# Patient Record
Sex: Female | Born: 1965 | Race: White | Hispanic: Yes | Smoking: Current every day smoker
Health system: Southern US, Community
[De-identification: ages and names within clinical notes are randomized; demographics above are authoritative.]

## PROBLEM LIST (undated history)

## (undated) ENCOUNTER — Ambulatory Visit (HOSPITAL_COMMUNITY): Admission: EM | Payer: Medicaid Other

## (undated) DIAGNOSIS — D649 Anemia, unspecified: Secondary | ICD-10-CM

## (undated) DIAGNOSIS — E119 Type 2 diabetes mellitus without complications: Secondary | ICD-10-CM

## (undated) HISTORY — PX: NO PAST SURGERIES: SHX2092

## (undated) HISTORY — DX: Type 2 diabetes mellitus without complications: E11.9

---

## 2000-07-30 ENCOUNTER — Encounter: Payer: Self-pay | Admitting: Internal Medicine

## 2000-07-30 ENCOUNTER — Ambulatory Visit (HOSPITAL_COMMUNITY): Admission: RE | Admit: 2000-07-30 | Discharge: 2000-07-30 | Payer: Self-pay | Admitting: Internal Medicine

## 2000-08-09 ENCOUNTER — Inpatient Hospital Stay (HOSPITAL_COMMUNITY): Admission: AD | Admit: 2000-08-09 | Discharge: 2000-08-09 | Payer: Self-pay | Admitting: Obstetrics & Gynecology

## 2000-08-09 ENCOUNTER — Encounter: Payer: Self-pay | Admitting: Obstetrics & Gynecology

## 2000-10-28 ENCOUNTER — Emergency Department (HOSPITAL_COMMUNITY): Admission: EM | Admit: 2000-10-28 | Discharge: 2000-10-28 | Payer: Self-pay | Admitting: Emergency Medicine

## 2002-08-13 ENCOUNTER — Ambulatory Visit (HOSPITAL_COMMUNITY): Admission: RE | Admit: 2002-08-13 | Discharge: 2002-08-13 | Payer: Self-pay | Admitting: *Deleted

## 2002-09-11 ENCOUNTER — Inpatient Hospital Stay (HOSPITAL_COMMUNITY): Admission: AD | Admit: 2002-09-11 | Discharge: 2002-09-11 | Payer: Self-pay | Admitting: *Deleted

## 2002-09-18 ENCOUNTER — Encounter (HOSPITAL_COMMUNITY): Admission: RE | Admit: 2002-09-18 | Discharge: 2002-09-18 | Payer: Self-pay | Admitting: *Deleted

## 2002-09-20 ENCOUNTER — Inpatient Hospital Stay (HOSPITAL_COMMUNITY): Admission: AD | Admit: 2002-09-20 | Discharge: 2002-09-20 | Payer: Self-pay | Admitting: Obstetrics and Gynecology

## 2002-09-20 ENCOUNTER — Inpatient Hospital Stay (HOSPITAL_COMMUNITY): Admission: AD | Admit: 2002-09-20 | Discharge: 2002-09-22 | Payer: Self-pay | Admitting: Obstetrics and Gynecology

## 2002-09-20 ENCOUNTER — Encounter: Payer: Self-pay | Admitting: Obstetrics and Gynecology

## 2007-11-18 ENCOUNTER — Emergency Department (HOSPITAL_COMMUNITY): Admission: EM | Admit: 2007-11-18 | Discharge: 2007-11-19 | Payer: Self-pay | Admitting: Emergency Medicine

## 2009-01-28 ENCOUNTER — Emergency Department (HOSPITAL_COMMUNITY): Admission: EM | Admit: 2009-01-28 | Discharge: 2009-01-28 | Payer: Self-pay | Admitting: Emergency Medicine

## 2010-10-15 LAB — HEPATITIS PANEL, ACUTE
HCV Ab: NEGATIVE
Hep A IgM: NEGATIVE

## 2010-10-15 LAB — COMPREHENSIVE METABOLIC PANEL
ALT: 80 U/L — ABNORMAL HIGH (ref 0–35)
AST: 66 U/L — ABNORMAL HIGH (ref 0–37)
Alkaline Phosphatase: 115 U/L (ref 39–117)
CO2: 27 mEq/L (ref 19–32)
Calcium: 9.1 mg/dL (ref 8.4–10.5)
GFR calc Af Amer: >60 mL/min (ref >60)
GFR calc non Af Amer: >60 mL/min (ref >60)
Glucose, Bld: 97 mg/dL (ref 70–99)
Potassium: 3.4 mEq/L — ABNORMAL LOW (ref 3.5–5.1)
Sodium: 142 mEq/L (ref 135–145)

## 2010-10-15 LAB — CBC
HCT: 38.8 % (ref 36.0–46.0)
Hemoglobin: 12.8 g/dL (ref 12.0–15.0)
MCHC: 32.9 g/dL (ref 30.0–36.0)
MCV: 81.7 fL (ref 78.0–100.0)
Platelets: 327 10*3/uL (ref 150–400)
RBC: 4.75 MIL/uL (ref 3.87–5.11)
RDW: 17.4 % — ABNORMAL HIGH (ref 11.5–15.5)
WBC: 6.9 10*3/uL (ref 4.0–10.5)

## 2010-10-15 LAB — URINE MICROSCOPIC-ADD ON

## 2010-10-15 LAB — URINALYSIS, ROUTINE W REFLEX MICROSCOPIC
Bilirubin Urine: NEGATIVE
Ketones, ur: NEGATIVE mg/dL
Specific Gravity, Urine: 1.02 (ref 1.005–1.030)
pH: 6.5 (ref 5.0–8.0)

## 2010-10-15 LAB — DIFFERENTIAL
Basophils Relative: 0 % (ref 0–1)
Eosinophils Absolute: 0.1 10*3/uL (ref 0.0–0.7)
Eosinophils Relative: 1 % (ref 0–5)
Lymphs Abs: 2.6 10*3/uL (ref 0.7–4.0)
Monocytes Relative: 8 % (ref 3–12)

## 2010-10-15 LAB — URINE CULTURE

## 2010-10-15 LAB — LIPASE, BLOOD: Lipase: 19 U/L (ref 11–59)

## 2011-06-15 ENCOUNTER — Emergency Department (HOSPITAL_COMMUNITY)
Admission: EM | Admit: 2011-06-15 | Discharge: 2011-06-15 | Disposition: A | Discharge status: Home / Self Care | Payer: Medicaid Other | Attending: Emergency Medicine | Admitting: Emergency Medicine

## 2011-06-15 ENCOUNTER — Encounter: Payer: Self-pay | Admitting: *Deleted

## 2011-06-15 ENCOUNTER — Emergency Department (HOSPITAL_COMMUNITY): Payer: Medicaid Other

## 2011-06-15 DIAGNOSIS — R05 Cough: Secondary | ICD-10-CM | POA: Insufficient documentation

## 2011-06-15 DIAGNOSIS — R112 Nausea with vomiting, unspecified: Secondary | ICD-10-CM | POA: Insufficient documentation

## 2011-06-15 DIAGNOSIS — IMO0001 Reserved for inherently not codable concepts without codable children: Secondary | ICD-10-CM | POA: Insufficient documentation

## 2011-06-15 DIAGNOSIS — R509 Fever, unspecified: Secondary | ICD-10-CM | POA: Insufficient documentation

## 2011-06-15 DIAGNOSIS — R51 Headache: Secondary | ICD-10-CM | POA: Insufficient documentation

## 2011-06-15 DIAGNOSIS — J069 Acute upper respiratory infection, unspecified: Secondary | ICD-10-CM | POA: Insufficient documentation

## 2011-06-15 DIAGNOSIS — R059 Cough, unspecified: Secondary | ICD-10-CM | POA: Insufficient documentation

## 2011-06-15 DIAGNOSIS — F17200 Nicotine dependence, unspecified, uncomplicated: Secondary | ICD-10-CM | POA: Insufficient documentation

## 2011-06-15 MED ORDER — ONDANSETRON 4 MG PO TBDP
4.0000 mg | ORAL_TABLET | Freq: Once | ORAL | Status: AC
  Administered 2011-06-15: 4 mg via ORAL
  Filled 2011-06-15: qty 1

## 2011-06-15 MED ORDER — OXYCODONE-ACETAMINOPHEN 5-325 MG PO TABS: 1.0000 | ORAL_TABLET | ORAL | Status: AC | PRN

## 2011-06-15 MED ORDER — ALBUTEROL SULFATE HFA 108 (90 BASE) MCG/ACT IN AERS
2.0000 | INHALATION_SPRAY | RESPIRATORY_TRACT | Status: DC | PRN
  Administered 2011-06-15: 2 via RESPIRATORY_TRACT
  Filled 2011-06-15: qty 6.7

## 2011-06-15 MED ORDER — OXYCODONE-ACETAMINOPHEN 5-325 MG PO TABS
1.0000 | ORAL_TABLET | Freq: Once | ORAL | Status: AC
  Administered 2011-06-15: 1 via ORAL
  Filled 2011-06-15: qty 1

## 2011-06-15 MED ORDER — ONDANSETRON 8 MG PO TBDP: 8.0000 mg | ORAL_TABLET | Freq: Three times a day (TID) | ORAL | Status: AC | PRN

## 2011-06-15 NOTE — ED Notes (Signed)
Patient reported to be sick for 2 weeks.  She has had cough with small amount of production.  She also reports fever.  Patient denies travel.  Patient complains of chest pain and bloody sputum as well that started yesterday

## 2011-06-15 NOTE — ED Provider Notes (Signed)
History     CSN: [id number] Arrival date & time: 06/15/2011  2:16 PM   First MD Initiated Contact with Patient 06/15/11 1641      Chief Complaint  Patient presents with  . Cough  . Fever  . Headache    (Consider location/radiation/quality/duration/timing/severity/associated sxs/prior treatment) Patient is a 45 y.o. female presenting with cough, fever, and headaches. The history is provided by the patient and a relative. The history is limited by a language barrier. A language interpreter was used.  Cough The current episode started more than 1 week ago. The problem occurs constantly. Associated symptoms include headaches and myalgias.  Fever Primary symptoms of the febrile illness include fever, headaches, cough, nausea, vomiting and myalgias. Primary symptoms do not include abdominal pain, dysuria or rash.  Headache  Associated symptoms include a fever, nausea and vomiting.   patient's had a cough for the last 2 weeks. Some fevers. She's coughed up a minimal amount of sputum. She also aches all over. She's also had nauseousness and vomiting. She's had some sick contacts with similar symptoms. She has myalgias. No relief with NyQuil at home.  No past medical history on file.  History reviewed. No pertinent past surgical history.  No family history on file.  History  Substance Use Topics  . Smoking status: Current Everyday Smoker  . Smokeless tobacco: Not on file  . Alcohol Use: Yes    OB History    Grav Para Term Preterm Abortions TAB SAB Ect Mult Living                  Review of Systems  Constitutional: Positive for fever.  Respiratory: Positive for cough.   Gastrointestinal: Positive for nausea and vomiting. Negative for abdominal pain.  Genitourinary: Negative for dysuria and flank pain.  Musculoskeletal: Positive for myalgias.  Skin: Negative for rash.  Neurological: Positive for headaches.    Allergies  Review of patient's allergies indicates no known  allergies.  Home Medications   Current Outpatient Rx  Name Route Sig Dispense Refill  . PSEUDOEPH-DOXYLAMINE-DM-APAP 60-7.12-05-998 MG/30ML PO LIQD Oral Take 30 mLs by mouth at bedtime as needed. For cold.     Marland Kitchen ONDANSETRON 8 MG PO TBDP Oral Take 1 tablet (8 mg total) by mouth every 8 (eight) hours as needed for nausea. 20 tablet 0  . OXYCODONE-ACETAMINOPHEN 5-325 MG PO TABS Oral Take 1 tablet by mouth every 4 (four) hours as needed for pain. 15 tablet 0    BP 132/90  Pulse 100  Temp(Src) 98.6 F (37 C) (Oral)  Resp 15  SpO2 95%  Physical Exam  Nursing note and vitals reviewed. Constitutional: She is oriented to person, place, and time. She appears well-developed and well-nourished.  HENT:  Head: Normocephalic and atraumatic.  Eyes: EOM are normal. Pupils are equal, round, and reactive to light.  Neck: Normal range of motion. Neck supple.  Cardiovascular: Regular rhythm and normal heart sounds.   No murmur heard.      Mild tachycardia  Pulmonary/Chest: Effort normal. No respiratory distress. She has wheezes. She has no rales.       Mild diffuse wheezes and prolonged expirations.  Abdominal: Soft. Bowel sounds are normal. She exhibits no distension. There is no tenderness. There is no rebound and no guarding.  Musculoskeletal: Normal range of motion.  Neurological: She is alert and oriented to person, place, and time. No cranial nerve deficit.  Skin: Skin is warm and dry.  Psychiatric: She has a normal mood  and affect. Her speech is normal.    ED Course  Procedures (including critical care time)  Labs Reviewed - No data to display Dg Chest 2 View  06/15/2011  *RADIOLOGY REPORT*  Clinical Data: Chest pain and shortness of breath.  CHEST - 2 VIEW  Comparison: Chest 11/19/2007.  Findings: Lungs are clear.  Heart size is normal.  No pneumothorax or effusion.  IMPRESSION: Negative chest.  Original Report Authenticated By: Bernadene Bell. D'ALESSIO, M.D.     1. URI (upper respiratory  infection)       MDM  Cough and URI symptoms. Negative x-ray. Patient is overall well-appearing. Her pulses normalize my exam. She be discharged home with nausea medicines and pain medicines and with an inhaler.        Juliet Rude. Rubin Payor, MD 06/15/11 1759

## 2011-06-15 NOTE — ED Notes (Signed)
Pt here with c/o cough, sore throat, fever and generalized body aches x 2 weeks.  Pt rates pain 10/10.

## 2012-11-05 ENCOUNTER — Encounter (HOSPITAL_COMMUNITY): Payer: Self-pay | Admitting: *Deleted

## 2012-11-05 ENCOUNTER — Telehealth (HOSPITAL_COMMUNITY): Payer: Self-pay | Admitting: *Deleted

## 2012-11-05 ENCOUNTER — Emergency Department (HOSPITAL_COMMUNITY)
Admission: EM | Admit: 2012-11-05 | Discharge: 2012-11-05 | Disposition: A | Discharge status: Home / Self Care | Payer: Self-pay | Attending: Emergency Medicine | Admitting: Emergency Medicine

## 2012-11-05 ENCOUNTER — Emergency Department (HOSPITAL_COMMUNITY): Payer: Self-pay

## 2012-11-05 DIAGNOSIS — F17200 Nicotine dependence, unspecified, uncomplicated: Secondary | ICD-10-CM | POA: Insufficient documentation

## 2012-11-05 DIAGNOSIS — J40 Bronchitis, not specified as acute or chronic: Secondary | ICD-10-CM

## 2012-11-05 DIAGNOSIS — J209 Acute bronchitis, unspecified: Secondary | ICD-10-CM | POA: Insufficient documentation

## 2012-11-05 LAB — POCT I-STAT, CHEM 8
Calcium, Ion: 1.23 mmol/L (ref 1.12–1.23)
Chloride: 105 mEq/L (ref 96–112)
HCT: 46 % (ref 36.0–46.0)
Potassium: 3.5 mEq/L (ref 3.5–5.1)
Sodium: 142 mEq/L (ref 135–145)

## 2012-11-05 LAB — D-DIMER, QUANTITATIVE: D-Dimer, Quant: <0.27 ug/mL-FEU (ref 0.00–0.48)

## 2012-11-05 MED ORDER — DOXYCYCLINE HYCLATE 100 MG PO CAPS: 100.0000 mg | ORAL_CAPSULE | Freq: Two times a day (BID) | ORAL | Status: DC

## 2012-11-05 MED ORDER — PREDNISONE 50 MG PO TABS: ORAL_TABLET | ORAL | Status: DC

## 2012-11-05 MED ORDER — ALBUTEROL (5 MG/ML) CONTINUOUS INHALATION SOLN
2.5000 mg/h | INHALATION_SOLUTION | Freq: Once | RESPIRATORY_TRACT | Status: AC
  Administered 2012-11-05: 2.5 mg/h via RESPIRATORY_TRACT
  Filled 2012-11-05: qty 20

## 2012-11-05 MED ORDER — ALBUTEROL SULFATE HFA 108 (90 BASE) MCG/ACT IN AERS: 1.0000 | INHALATION_SPRAY | Freq: Four times a day (QID) | RESPIRATORY_TRACT | Status: DC | PRN

## 2012-11-05 NOTE — ED Provider Notes (Signed)
History     CSN: [id number]  Arrival date & time 11/05/12  1237   First MD Initiated Contact with Patient 11/05/12 1342      Chief Complaint  Patient presents with  . Shortness of Breath    (Consider location/radiation/quality/duration/timing/severity/associated sxs/prior treatment) Patient is a 47 y.o. female presenting with shortness of breath. The history is provided by the patient. No language interpreter was used.  Shortness of Breath Severity:  Moderate Onset quality:  Gradual Duration:  1 day Timing:  Constant Progression:  Worsening Chronicity:  New Relieved by:  Nothing Worsened by:  Nothing tried Ineffective treatments:  None tried Associated symptoms: no chest pain, no vomiting and no wheezing   Risk factors: no recent alcohol use and no tobacco use     History reviewed. No pertinent past medical history.  History reviewed. No pertinent past surgical history.  History reviewed. No pertinent family history.  History  Substance Use Topics  . Smoking status: Current Every Day Smoker  . Smokeless tobacco: Not on file  . Alcohol Use: Yes    OB History   Grav Para Term Preterm Abortions TAB SAB Ect Mult Living                  Review of Systems  Respiratory: Positive for shortness of breath. Negative for wheezing.   Cardiovascular: Negative for chest pain.  Gastrointestinal: Negative for vomiting.  All other systems reviewed and are negative.    Allergies  Review of patient's allergies indicates no known allergies.  Home Medications   Current Outpatient Rx  Name  Route  Sig  Dispense  Refill  . Pseudoeph-Doxylamine-DM-APAP (NYQUIL) 60-7.12-05-998 MG/30ML LIQD   Oral   Take 30 mLs by mouth at bedtime as needed. For cold.            BP 147/79  Pulse 72  Temp(Src) 98 F (36.7 C) (Oral)  Resp 18  SpO2 95%  Physical Exam  Nursing note and vitals reviewed. Constitutional: She is oriented to person, place, and time. She appears  well-developed and well-nourished.  HENT:  Head: Normocephalic and atraumatic.  Right Ear: External ear normal.  Left Ear: External ear normal.  Nose: Nose normal.  Mouth/Throat: Oropharynx is clear and moist.  Eyes: Conjunctivae are normal. Pupils are equal, round, and reactive to light.  Neck: Normal range of motion. Neck supple.  Cardiovascular: Normal rate, normal heart sounds and intact distal pulses.   Pulmonary/Chest: Effort normal and breath sounds normal.  Abdominal: Soft. Bowel sounds are normal.  Musculoskeletal: Normal range of motion.  Neurological: She is alert and oriented to person, place, and time. She has normal reflexes.  Skin: Skin is warm.  Psychiatric: She has a normal mood and affect.    ED Course  Procedures (including critical care time)  Labs Reviewed  POCT I-STAT, CHEM 8 - Abnormal; Notable for the following:    Glucose, Bld 122 (*)    Hemoglobin 15.6 (*)    All other components within normal limits   No results found.   No diagnosis found.    MDM   Date: 11/05/2012  Rate: 67  Rhythm: normal sinus rhythm  QRS Axis: normal  Intervals: normal  ST/T Wave abnormalities: normal  Conduction Disutrbances:none  Narrative Interpretation:   Old EKG Reviewed: none available         Elson Areas, PA-C 11/05/12 1500

## 2012-11-05 NOTE — ED Notes (Signed)
Per family member that is translator, pt unable to tolerate mask.  RT notified and at bedside.

## 2012-11-05 NOTE — ED Provider Notes (Signed)
Care assumed from [facility]. Patient receiving albuterol and awaiting d-dimer.  D-dimer negative. Patient ambulatory without desaturation. We'll treat for bronchitis. Smoking cessation encouraged.  BP 124/76  Pulse 98  Temp(Src) 98 F (36.7 C) (Oral)  Resp 16  SpO2 94%   Glynn Octave, MD 11/05/12 [phone]

## 2012-11-05 NOTE — ED Notes (Signed)
RT paged for continuous albuterol treatment.

## 2012-11-05 NOTE — ED Notes (Signed)
Reports having sob since yesterday and feels like she is going to pass out. spo2 95% on room air. Pt ambulatory, no acute distress noted at this time.

## 2012-11-05 NOTE — ED Notes (Signed)
Checked patient pulse ox walking it was 96 room air then dropped down to 94 and back up to 96 room air while walking

## 2012-11-05 NOTE — ED Notes (Signed)
D/c prescriptions called in to [facility] pharmacy.

## 2012-11-07 NOTE — ED Provider Notes (Signed)
Medical screening examination/treatment/procedure(s) were performed by non-physician practitioner and as supervising physician I was immediately available for consultation/collaboration.   Gwyneth Sprout, MD 11/07/12 1414

## 2012-12-01 ENCOUNTER — Emergency Department (HOSPITAL_COMMUNITY): Payer: Self-pay

## 2012-12-01 ENCOUNTER — Encounter (HOSPITAL_COMMUNITY): Payer: Self-pay

## 2012-12-01 DIAGNOSIS — F17200 Nicotine dependence, unspecified, uncomplicated: Secondary | ICD-10-CM | POA: Insufficient documentation

## 2012-12-01 DIAGNOSIS — R062 Wheezing: Secondary | ICD-10-CM | POA: Insufficient documentation

## 2012-12-01 MED ORDER — IPRATROPIUM BROMIDE 0.02 % IN SOLN
0.5000 mg | Freq: Once | RESPIRATORY_TRACT | Status: AC
  Administered 2012-12-01: 0.5 mg via RESPIRATORY_TRACT
  Filled 2012-12-01: qty 2.5

## 2012-12-01 MED ORDER — ALBUTEROL SULFATE (5 MG/ML) 0.5% IN NEBU
5.0000 mg | INHALATION_SOLUTION | Freq: Once | RESPIRATORY_TRACT | Status: AC
  Administered 2012-12-01: 5 mg via RESPIRATORY_TRACT
  Filled 2012-12-01: qty 1

## 2012-12-01 NOTE — ED Notes (Signed)
Pt c/o SOB starting approx 45 mins ago, audible wheezing noted in triage. Pt denies chest pain, N/V, cough or congestion. Pt reports hx of the same 1 month ago. Received neb treatments and sent home

## 2012-12-02 ENCOUNTER — Emergency Department (HOSPITAL_COMMUNITY)
Admission: EM | Admit: 2012-12-02 | Discharge: 2012-12-02 | Disposition: A | Discharge status: Home / Self Care | Payer: Self-pay | Attending: Emergency Medicine | Admitting: Emergency Medicine

## 2012-12-02 DIAGNOSIS — R062 Wheezing: Secondary | ICD-10-CM

## 2012-12-02 LAB — CBC
HCT: 40.9 % (ref 36.0–46.0)
RDW: 13.1 % (ref 11.5–15.5)
WBC: 5.8 10*3/uL (ref 4.0–10.5)

## 2012-12-02 LAB — BASIC METABOLIC PANEL
Chloride: 106 mEq/L (ref 96–112)
Creatinine, Ser: 0.53 mg/dL (ref 0.50–1.10)
GFR calc Af Amer: >90 mL/min (ref >90)
Potassium: 3.5 mEq/L (ref 3.5–5.1)
Sodium: 144 mEq/L (ref 135–145)

## 2012-12-02 MED ORDER — ALBUTEROL SULFATE (5 MG/ML) 0.5% IN NEBU
5.0000 mg | INHALATION_SOLUTION | Freq: Once | RESPIRATORY_TRACT | Status: AC
  Administered 2012-12-02: 5 mg via RESPIRATORY_TRACT
  Filled 2012-12-02: qty 1

## 2012-12-02 MED ORDER — PREDNISONE 20 MG PO TABS
60.0000 mg | ORAL_TABLET | Freq: Once | ORAL | Status: AC
  Administered 2012-12-02: 60 mg via ORAL
  Filled 2012-12-02: qty 3

## 2012-12-02 MED ORDER — LEVOFLOXACIN 750 MG PO TABS
750.0000 mg | ORAL_TABLET | Freq: Once | ORAL | Status: AC
  Administered 2012-12-02: 750 mg via ORAL
  Filled 2012-12-02: qty 1

## 2012-12-02 MED ORDER — ALBUTEROL SULFATE HFA 108 (90 BASE) MCG/ACT IN AERS: 1.0000 | INHALATION_SPRAY | Freq: Four times a day (QID) | RESPIRATORY_TRACT | Status: DC | PRN

## 2012-12-02 MED ORDER — PREDNISONE 20 MG PO TABS: 60.0000 mg | ORAL_TABLET | Freq: Every day | ORAL | Status: DC

## 2012-12-02 MED ORDER — LEVOFLOXACIN 500 MG PO TABS: 500.0000 mg | ORAL_TABLET | Freq: Every day | ORAL | Status: DC

## 2012-12-02 NOTE — ED Provider Notes (Signed)
History     CSN: [id number]  Arrival date & time 12/01/12  2250   First MD Initiated Contact with Patient 12/02/12 0043      Chief Complaint  Patient presents with  . Shortness of Breath    (Consider location/radiation/quality/duration/timing/severity/associated sxs/prior treatment) HPI Hx per PT and daughter - is a smoker, no formal Dx of asthma, has had wheezing in the past.  Today SOB and wheezing onset 2 days ago, using an inhaler intermittently. No F/C, some dry cough. Today inc SOB with exertion, no leg swelling, no CP, MOD in severity, h/o same History reviewed. No pertinent past medical history.  History reviewed. No pertinent past surgical history.  History reviewed. No pertinent family history.  History  Substance Use Topics  . Smoking status: Current Every Day Smoker  . Smokeless tobacco: Not on file  . Alcohol Use: Yes    OB History   Grav Para Term Preterm Abortions TAB SAB Ect Mult Living                  Review of Systems  Constitutional: Negative for fever and chills.  HENT: Negative for neck pain and neck stiffness.   Eyes: Negative for pain.  Respiratory: Positive for shortness of breath and wheezing.   Cardiovascular: Negative for chest pain.  Gastrointestinal: Negative for abdominal pain.  Genitourinary: Negative for dysuria.  Musculoskeletal: Negative for back pain.  Skin: Negative for rash.  Neurological: Negative for headaches.  All other systems reviewed and are negative.    Allergies  Review of patient's allergies indicates no known allergies.  Home Medications   Current Outpatient Rx  Name  Route  Sig  Dispense  Refill  . albuterol (PROVENTIL HFA;VENTOLIN HFA) 108 (90 BASE) MCG/ACT inhaler   Inhalation   Inhale 1-2 puffs into the lungs every 6 (six) hours as needed for wheezing.   1 Inhaler   0   . doxycycline (VIBRAMYCIN) 100 MG capsule   Oral   Take 1 capsule (100 mg total) by mouth 2 (two) times daily.   20 capsule   0   . ibuprofen (ADVIL,MOTRIN) 200 MG tablet   Oral   Take 400 mg by mouth every 6 (six) hours as needed for pain or headache.         . predniSONE (DELTASONE) 50 MG tablet      1 tablet PO daily   5 tablet   0     BP 163/90  Pulse 118  Temp(Src) 98.2 F (36.8 C) (Oral)  Resp 16  SpO2 98%  Physical Exam  Nursing note and vitals reviewed. Constitutional: She is oriented to person, place, and time. She appears well-developed and well-nourished.  HENT:  Head: Normocephalic and atraumatic.  Mouth/Throat: Oropharynx is clear and moist. No oropharyngeal exudate.  Eyes: EOM are normal. Pupils are equal, round, and reactive to light.  Neck: Neck supple.  Cardiovascular: Normal rate, regular rhythm, normal heart sounds and intact distal pulses.   Pulmonary/Chest: Effort normal. No respiratory distress.  Bilateral exp wheezes with prolonged expirations  Abdominal: Soft. Bowel sounds are normal. She exhibits no distension. There is no tenderness.  Musculoskeletal: Normal range of motion. She exhibits no edema and no tenderness.  Neurological: She is alert and oriented to person, place, and time.  Skin: Skin is warm and dry.    ED Course  Procedures (including critical care time)  Results for orders placed during the hospital encounter of 12/02/12  BASIC METABOLIC PANEL  Result Value Range   Sodium 144  135 - 145 mEq/L   Potassium 3.5  3.5 - 5.1 mEq/L   Chloride 106  96 - 112 mEq/L   CO2 25  19 - 32 mEq/L   Glucose, Bld 118 (*) 70 - 99 mg/dL   BUN 10  6 - 23 mg/dL   Creatinine, Ser 1.61  0.50 - 1.10 mg/dL   Calcium 9.3  8.4 - 09.6 mg/dL   GFR calc non Af Amer >90  >90 mL/min   GFR calc Af Amer >90  >90 mL/min  CBC      Result Value Range   WBC 5.8  4.0 - 10.5 K/uL   RBC 4.47  3.87 - 5.11 MIL/uL   Hemoglobin 14.3  12.0 - 15.0 g/dL   HCT 04.5  40.9 - 81.1 %   MCV 91.5  78.0 - 100.0 fL   MCH 32.0  26.0 - 34.0 pg   MCHC 35.0  30.0 - 36.0 g/dL   RDW 91.4  78.2 - 95.6 %    Platelets 254  150 - 400 K/uL   Dg Chest 2 View  12/01/2012   *RADIOLOGY REPORT*  Clinical Data: Cough and severe shortness of breath.  CHEST - 2 VIEW  Comparison: Chest radiograph performed 11/05/2012  Findings: The lungs are well-aerated.  Mild left basilar opacity could reflect mild pneumonia, as it appears new from the prior study.  There is no evidence of pleural effusion or pneumothorax.  The heart is normal in size; the mediastinal contour is within normal limits.  No acute osseous abnormalities are seen.  IMPRESSION: Mild left basilar opacity could reflect mild pneumonia.   Original Report Authenticated By: Tonia Ghent, M.D.    Albuterol treatment in triage  12:45 AM still wheezing, CXR and labs reviewed, steroids and repeat albuterol provided  Recheck - lung sounds improved, I encouraged PT to stop smoking, referral provided, RX pred and albuterol and ABX for possible infilt although her presentation does not suggest infection.  MDM   COPD  Labs/ CXR ordered by triage RN reviewed as above  Improved with albuterol and steroids  VS and nursing notes reviewed and considered      Sunnie Nielsen, MD 12/02/12 [phone]

## 2012-12-02 NOTE — ED Notes (Signed)
Pt refusing breathing treatment, states "it makes me wheeze worse." EDP Opitz informed

## 2012-12-09 IMAGING — CR DG CHEST 2V
2 series · 2 of 2 positions shown · non-contrast
Comparison: Chest 11/19/2007.

CLINICAL DATA: Chest pain and shortness of breath.

CHEST - 2 VIEW

[w chest pa]
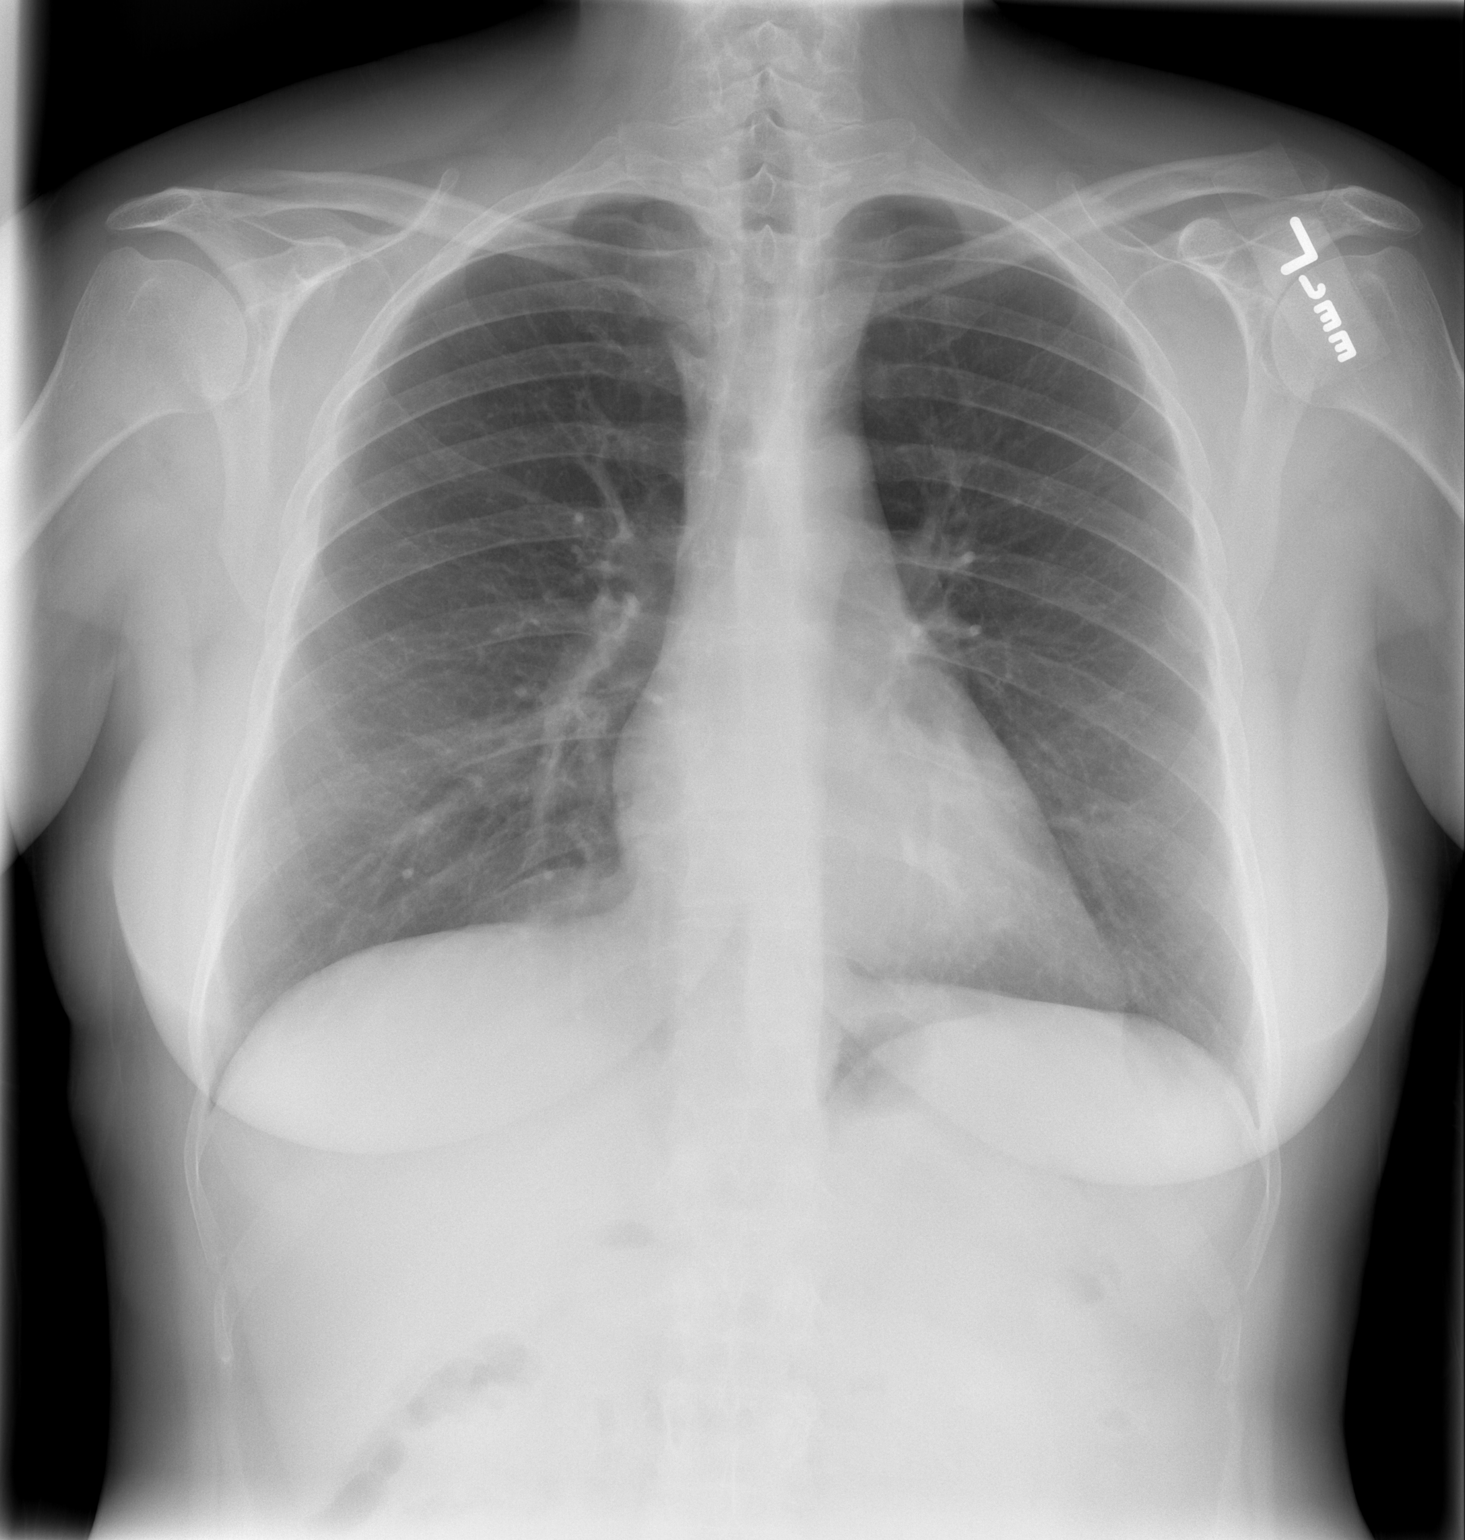

[w chest lat]
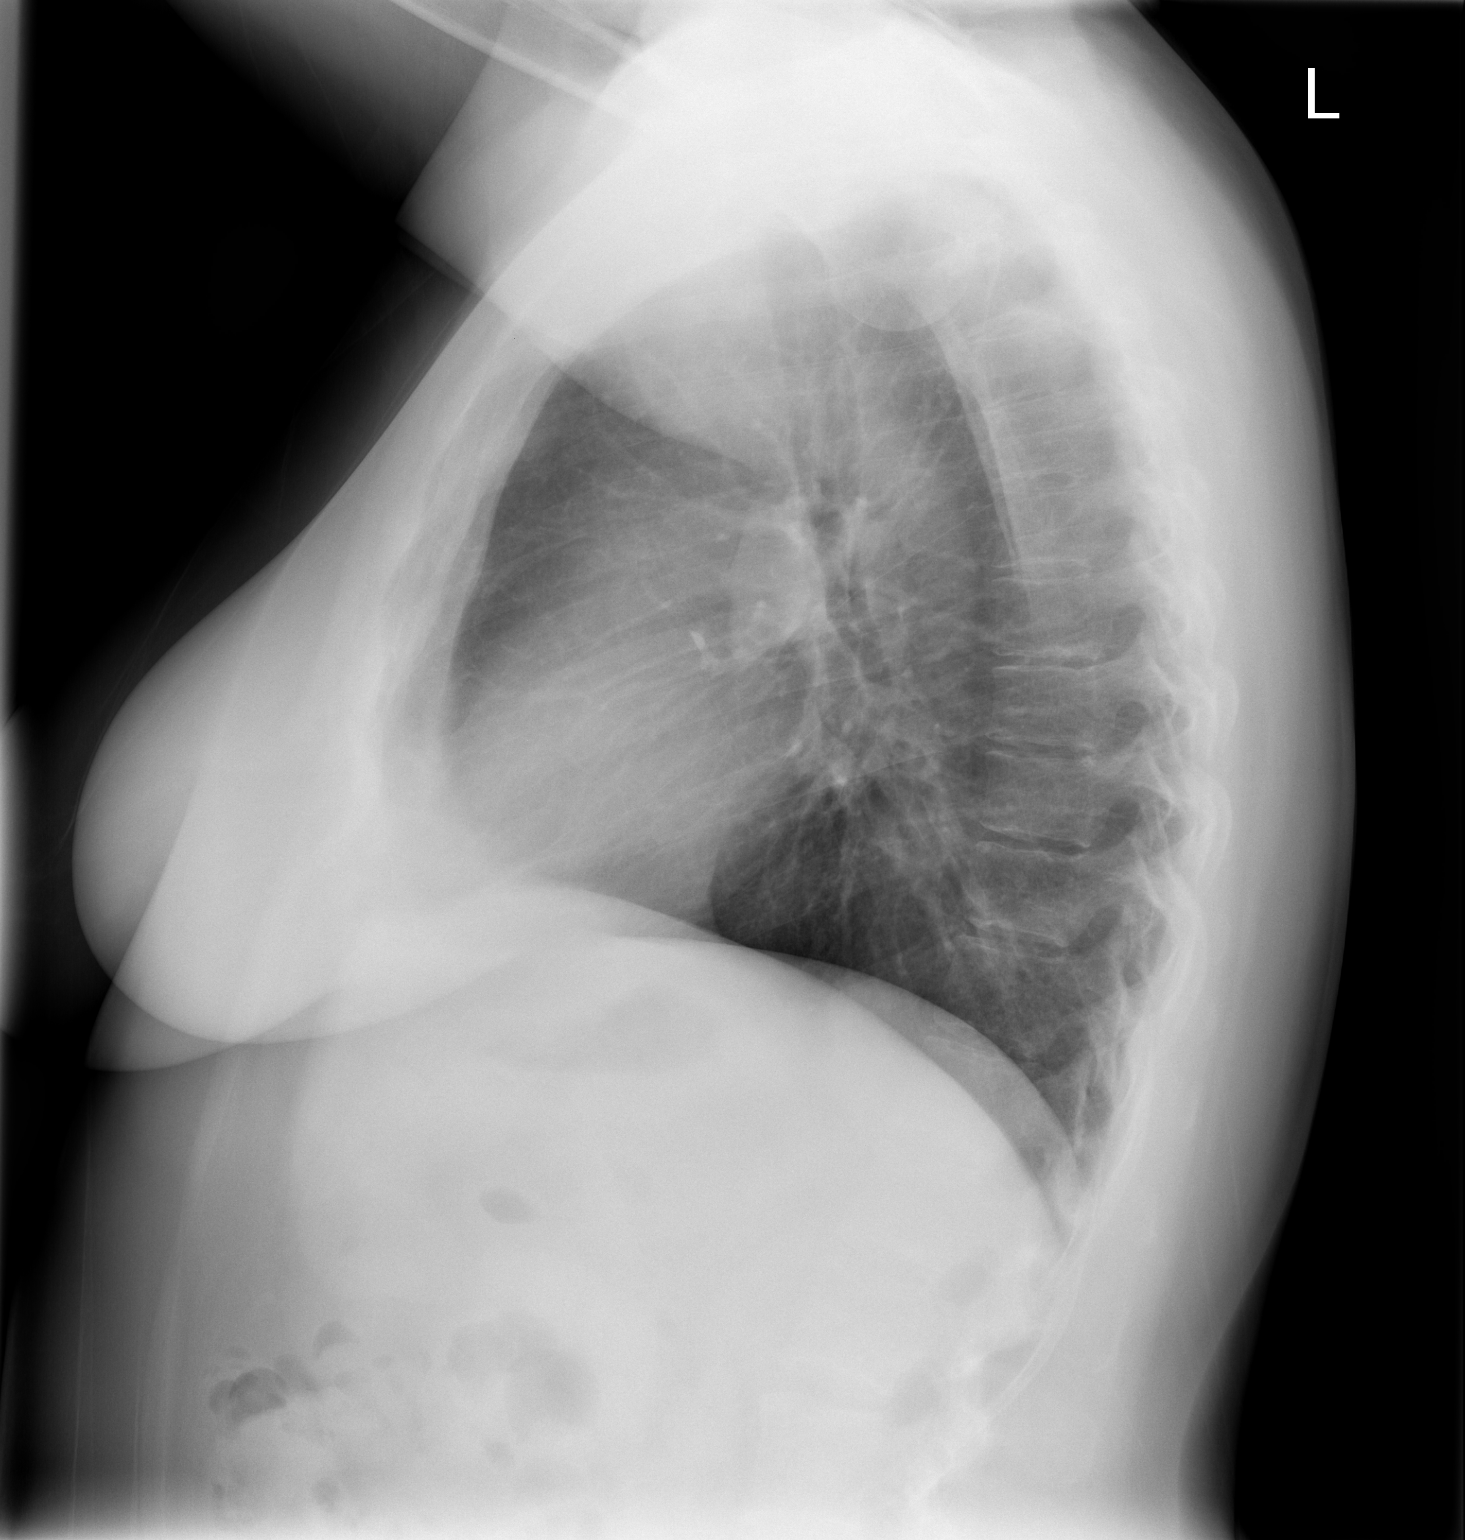

[2 of 2 positions shown; findings below may reference images not displayed]

FINDINGS: Lungs are clear.  Heart size is normal.  No pneumothorax
or effusion.
IMPRESSION: Negative chest.

## 2014-05-02 IMAGING — CR DG CHEST 2V
2 series · 2 of 2 positions shown · non-contrast
Comparison: 06/15/2011.

CLINICAL DATA: Shortness of breath.

CHEST - 2 VIEW

[w chest pa]
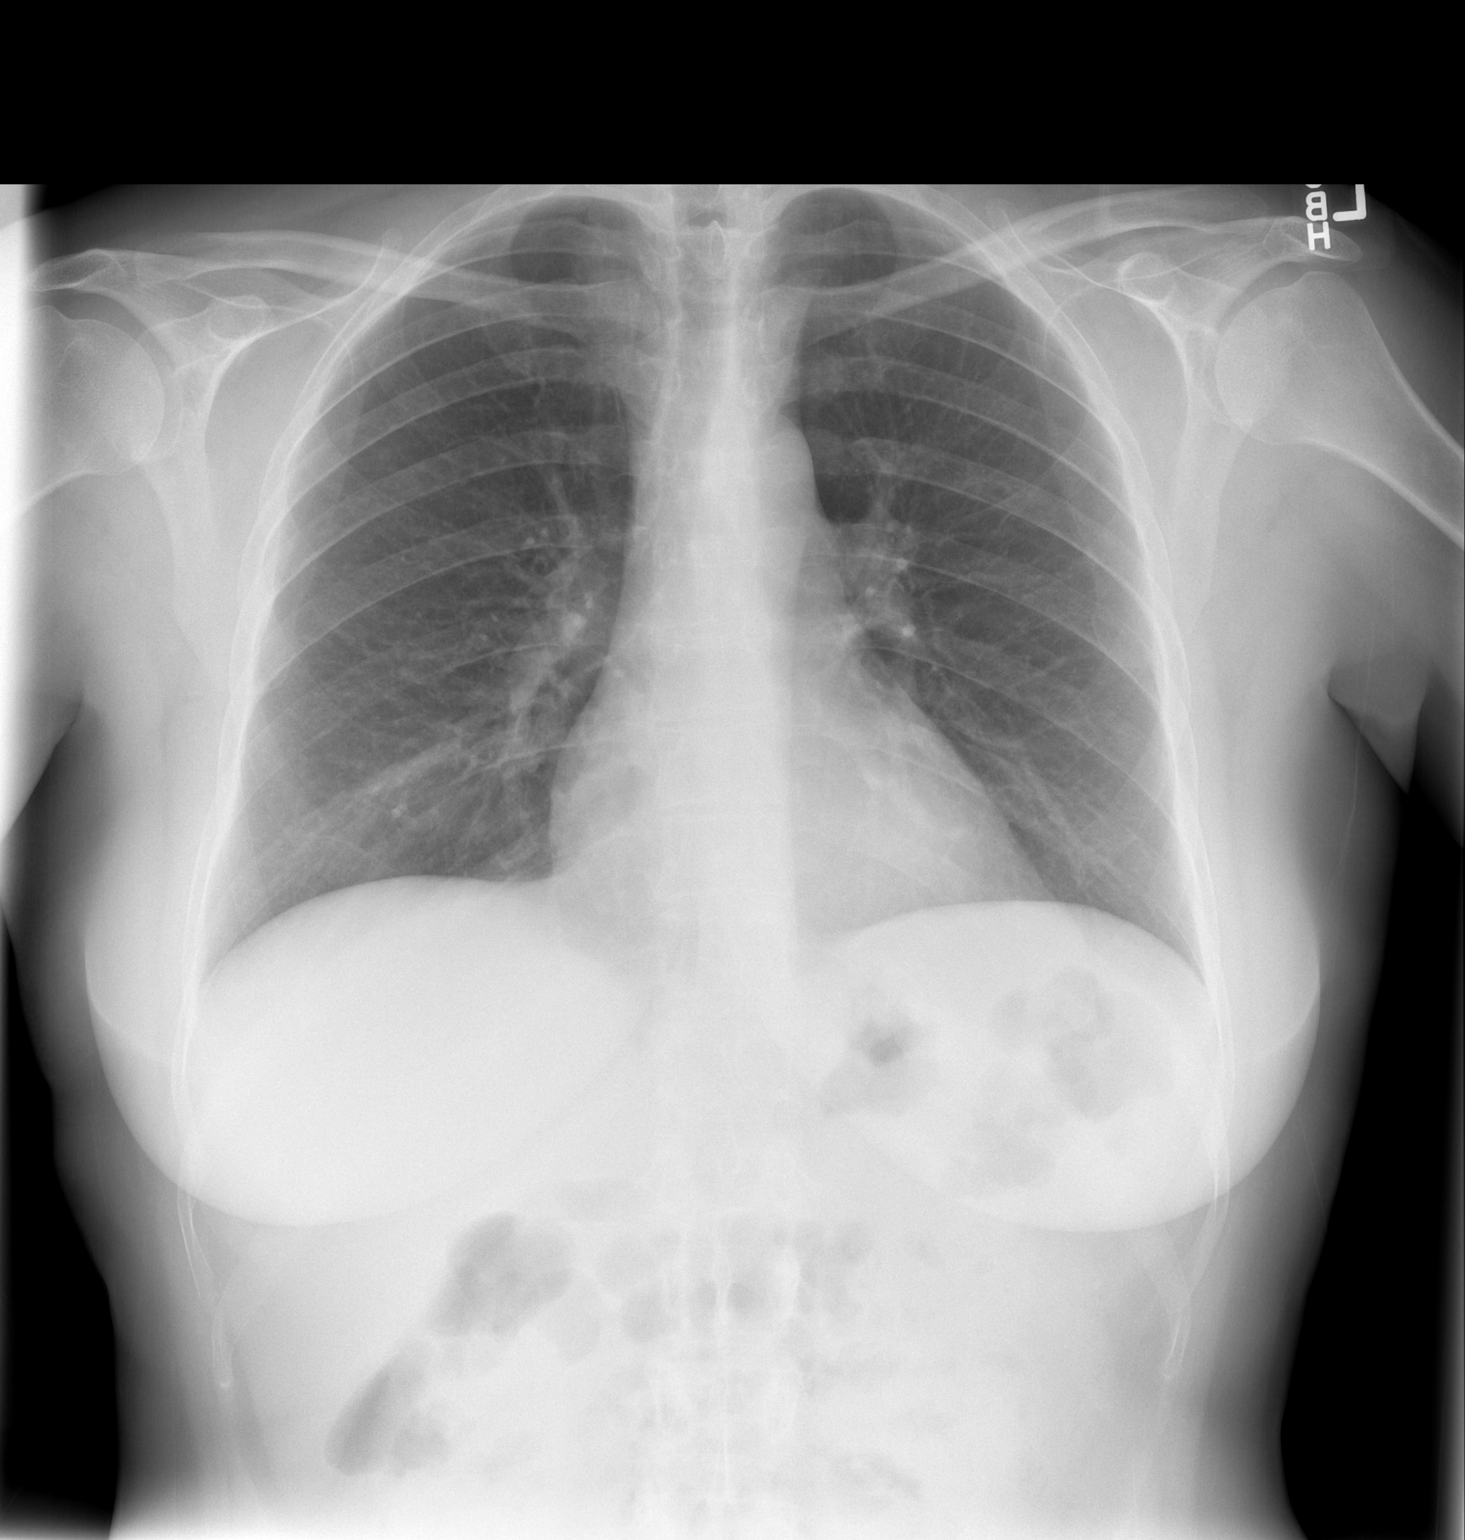

[w chest lat]
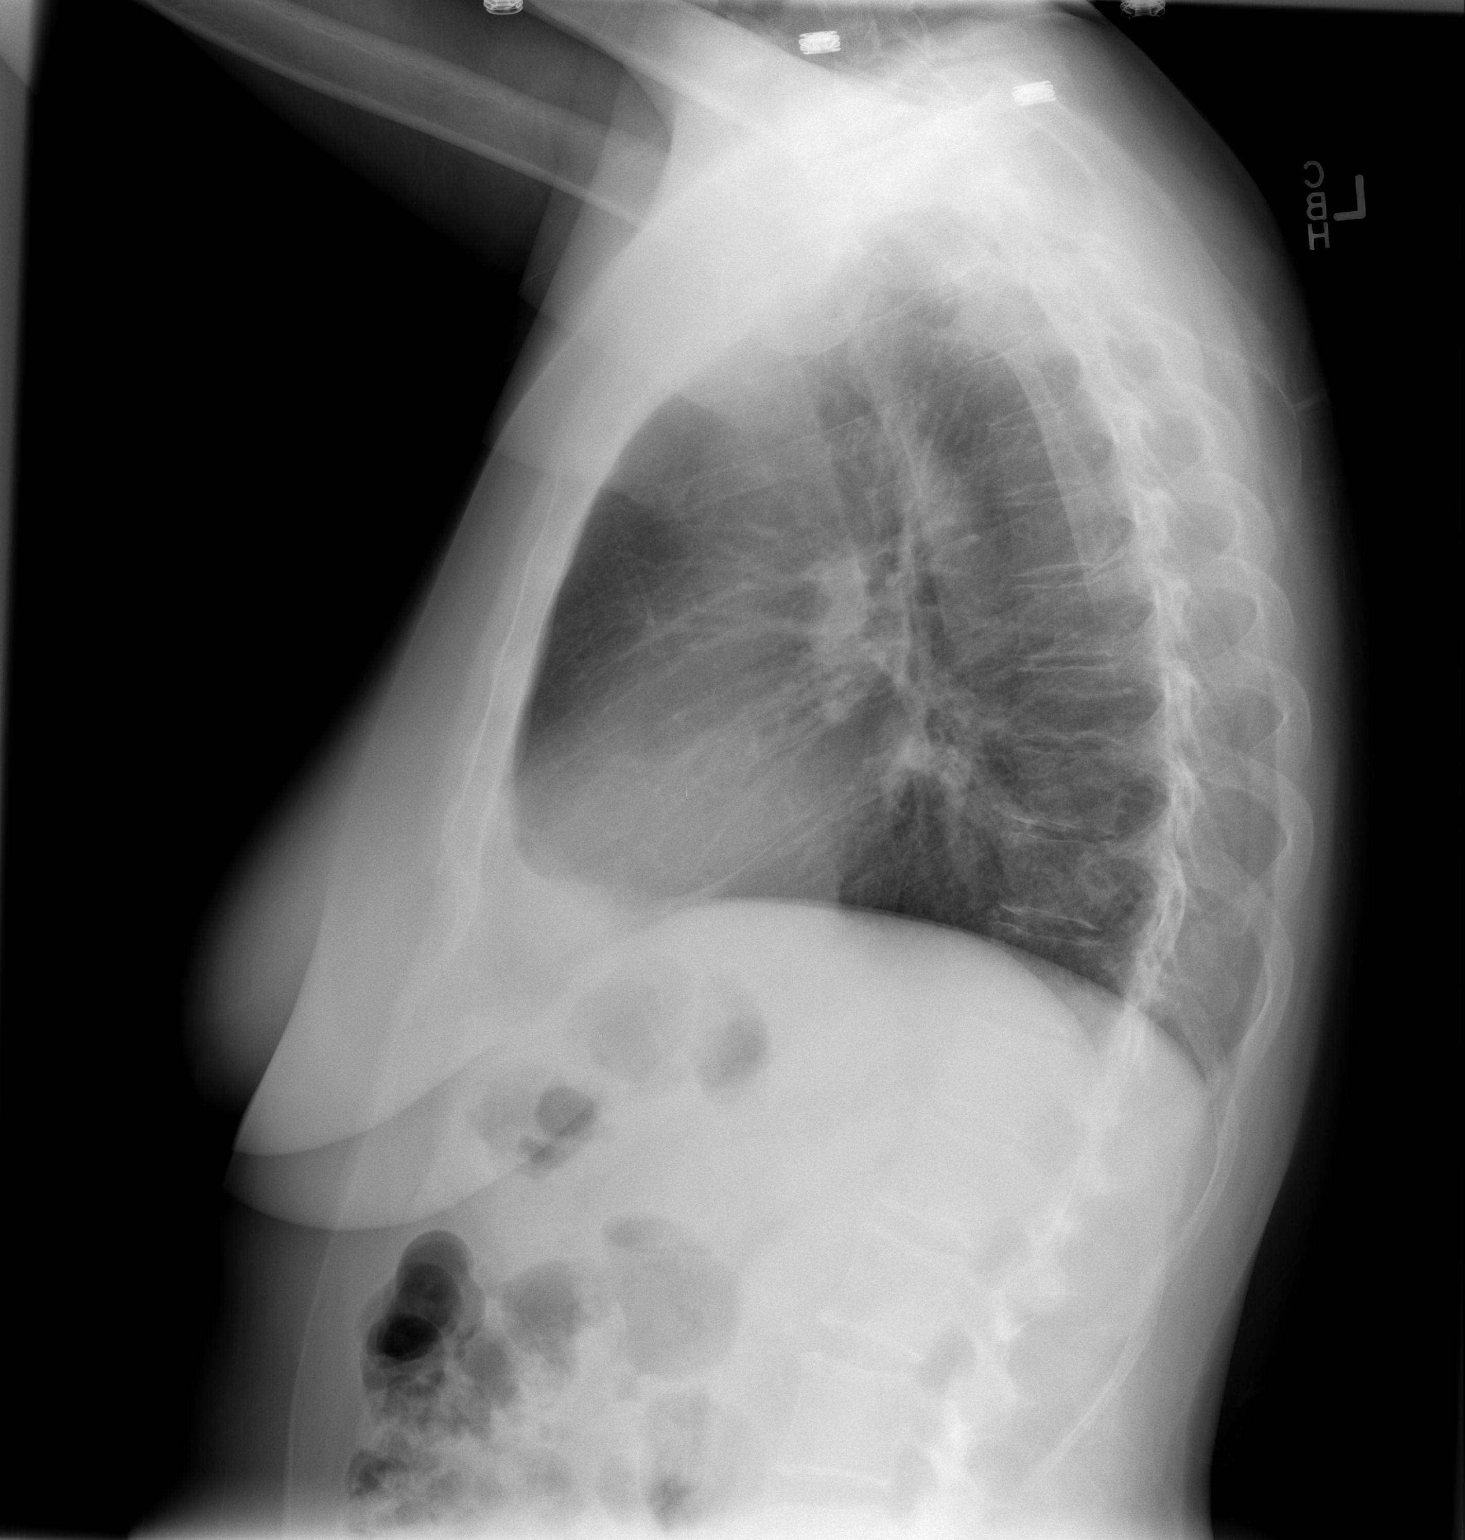

[2 of 2 positions shown; findings below may reference images not displayed]

FINDINGS: The cardiac silhouette, mediastinal and hilar contours
are within normal limits and stable. The lungs are clear.  No
pleural effusions.  The bony thorax is intact.
IMPRESSION: Normal chest x-ray.

## 2014-09-30 ENCOUNTER — Emergency Department (HOSPITAL_COMMUNITY): Payer: Medicaid Other

## 2014-09-30 ENCOUNTER — Encounter (HOSPITAL_COMMUNITY): Payer: Self-pay | Admitting: Emergency Medicine

## 2014-09-30 ENCOUNTER — Emergency Department (HOSPITAL_COMMUNITY)
Admission: EM | Admit: 2014-09-30 | Discharge: 2014-10-01 | Disposition: A | Discharge status: Home / Self Care | Payer: Medicaid Other | Attending: Emergency Medicine | Admitting: Emergency Medicine

## 2014-09-30 DIAGNOSIS — Z720 Tobacco use: Secondary | ICD-10-CM | POA: Insufficient documentation

## 2014-09-30 DIAGNOSIS — Z79899 Other long term (current) drug therapy: Secondary | ICD-10-CM | POA: Diagnosis not present

## 2014-09-30 DIAGNOSIS — R51 Headache: Secondary | ICD-10-CM | POA: Insufficient documentation

## 2014-09-30 DIAGNOSIS — M542 Cervicalgia: Secondary | ICD-10-CM | POA: Insufficient documentation

## 2014-09-30 DIAGNOSIS — R42 Dizziness and giddiness: Secondary | ICD-10-CM | POA: Diagnosis not present

## 2014-09-30 DIAGNOSIS — R110 Nausea: Secondary | ICD-10-CM | POA: Insufficient documentation

## 2014-09-30 DIAGNOSIS — R911 Solitary pulmonary nodule: Secondary | ICD-10-CM | POA: Diagnosis not present

## 2014-09-30 DIAGNOSIS — Z7952 Long term (current) use of systemic steroids: Secondary | ICD-10-CM | POA: Diagnosis not present

## 2014-09-30 DIAGNOSIS — Z792 Long term (current) use of antibiotics: Secondary | ICD-10-CM | POA: Insufficient documentation

## 2014-09-30 DIAGNOSIS — R519 Headache, unspecified: Secondary | ICD-10-CM

## 2014-09-30 LAB — CBC
HCT: 44.7 % (ref 36.0–46.0)
Hemoglobin: 15.1 g/dL — ABNORMAL HIGH (ref 12.0–15.0)
MCH: 31.5 pg (ref 26.0–34.0)
MCHC: 33.8 g/dL (ref 30.0–36.0)
MCV: 93.1 fL (ref 78.0–100.0)
Platelets: 301 10*3/uL (ref 150–400)
RBC: 4.8 MIL/uL (ref 3.87–5.11)
RDW: 13.8 % (ref 11.5–15.5)
WBC: 6.7 10*3/uL (ref 4.0–10.5)

## 2014-09-30 LAB — BASIC METABOLIC PANEL
Anion gap: 13 (ref 5–15)
BUN: 11 mg/dL (ref 6–23)
CO2: 23 mmol/L (ref 19–32)
Calcium: 9.6 mg/dL (ref 8.4–10.5)
Chloride: 103 mmol/L (ref 96–112)
Creatinine, Ser: 0.74 mg/dL (ref 0.50–1.10)
GFR calc non Af Amer: >90 mL/min (ref >90)
GLUCOSE: 105 mg/dL — AB (ref 70–99)
POTASSIUM: 3.8 mmol/L (ref 3.5–5.1)
SODIUM: 139 mmol/L (ref 135–145)

## 2014-09-30 MED ORDER — IOHEXOL 350 MG/ML SOLN
50.0000 mL | Freq: Once | INTRAVENOUS | Status: AC | PRN
  Administered 2014-09-30: 50 mL via INTRAVENOUS

## 2014-09-30 MED ORDER — SODIUM CHLORIDE 0.9 % IV BOLUS (SEPSIS)
1000.0000 mL | Freq: Once | INTRAVENOUS | Status: AC
  Administered 2014-09-30: 1000 mL via INTRAVENOUS

## 2014-09-30 NOTE — ED Notes (Addendum)
Pt states has had a headache for 1 month-- intermittently, now constant for today-- right side of head and neck  Daughter-- Vernona RiegerLaura-- with pt-- speaks [place]

## 2014-09-30 NOTE — ED Provider Notes (Signed)
CSN: [id number]     Arrival date & time 09/30/14  1847 History   First MD Initiated Contact with Patient 09/30/14 2129     Chief Complaint  Patient presents with  . Headache     (Consider location/radiation/quality/duration/timing/severity/associated sxs/prior Treatment) HPI  49 year old female presents with intermittent headaches for the past one month. History is taken from her daughter who speaks [place]. The patient has had headaches similar to this since she was 49 years old, but over the past one month and been worse. She has new dizziness associated with these as well. The pain is mostly right neck and right occipital scalp. She also feels swelling to her scalp that is been there for "a while" and she is worried she has cancer. Has nausea without vomiting. Unable to describe exactly what the pain feels like. No fevers, neck stiffness, or weakness or numbness. No visual disturbance. Has been taking ibuprofen intermittently and that helps the pain. At worst the pain is a 6 or 7 out of 10, currently 2 out of 10. No PCP.  History reviewed. No pertinent past medical history. History reviewed. No pertinent past surgical history. No family history on file. History  Substance Use Topics  . Smoking status: Current Every Day Smoker -- 0.50 packs/day    Types: Cigarettes  . Smokeless tobacco: Not on file  . Alcohol Use: Yes     Comment: occasionally   OB History    No data available     Review of Systems  Constitutional: Negative for fever and chills.  Eyes: Negative for visual disturbance.  Gastrointestinal: Positive for nausea. Negative for vomiting.  Musculoskeletal: Positive for neck pain. Negative for neck stiffness.  Neurological: Positive for dizziness and headaches. Negative for weakness and numbness.  All other systems reviewed and are negative.     Allergies  Review of patient's allergies indicates no known allergies.  Home Medications   Prior to Admission  medications   Medication Sig Start Date End Date Taking? Authorizing Provider  albuterol (PROVENTIL HFA;VENTOLIN HFA) 108 (90 BASE) MCG/ACT inhaler Inhale 1-2 puffs into the lungs every 6 (six) hours as needed for wheezing. 11/05/12   Glynn Octave, MD  albuterol (PROVENTIL HFA;VENTOLIN HFA) 108 (90 BASE) MCG/ACT inhaler Inhale 1-2 puffs into the lungs every 6 (six) hours as needed for wheezing. 12/02/12   Sunnie Nielsen, MD  levofloxacin (LEVAQUIN) 500 MG tablet Take 1 tablet (500 mg total) by mouth daily. 12/02/12   Sunnie Nielsen, MD  predniSONE (DELTASONE) 20 MG tablet Take 3 tablets (60 mg total) by mouth daily. 12/02/12   Sunnie Nielsen, MD   BP 152/72 mmHg  Pulse 70  Temp(Src) 98.7 F (37.1 C) (Oral)  Resp 14  Ht 5' (1.524 m)  Wt 149 lb 9.6 oz (67.858 kg)  BMI 29.22 kg/m2  SpO2 97%  LMP 09/13/2014 Physical Exam  Constitutional: She is oriented to person, place, and time. She appears well-developed and well-nourished. No distress.  HENT:  Head: Normocephalic and atraumatic.  Right Ear: External ear normal.  Left Ear: External ear normal.  Nose: Nose normal.  No focal swelling or tenderness to scalp  Eyes: EOM are normal. Pupils are equal, round, and reactive to light. Right eye exhibits no discharge. Left eye exhibits no discharge.  Neck: Normal range of motion. Neck supple.  No meningismus  Cardiovascular: Normal rate, regular rhythm and normal heart sounds.   Pulmonary/Chest: Effort normal and breath sounds normal.  Abdominal: Soft. She exhibits no distension. There is  no tenderness.  Neurological: She is alert and oriented to person, place, and time.  CN II-12 grossly intact. 5/5 strength in all 4 extremities. Grossly normal sensation.  Skin: Skin is warm and dry. She is not diaphoretic.  Nursing note and vitals reviewed.   ED Course  Procedures (including critical care time) Labs Review Labs Reviewed  BASIC METABOLIC PANEL - Abnormal; Notable for the following:    Glucose,  Bld 105 (*)    All other components within normal limits  CBC - Abnormal; Notable for the following:    Hemoglobin 15.1 (*)    All other components within normal limits    Imaging Review Ct Angio Head W/cm &/or Wo Cm  09/30/2014   EXAM: CT ANGIOGRAPHY HEAD AND NECK  TECHNIQUE: Multidetector CT imaging of the head and neck was performed using the standard protocol during bolus administration of intravenous contrast. Multiplanar CT image reconstructions and MIPs were obtained to evaluate the vascular anatomy. Carotid stenosis measurements (when applicable) are obtained utilizing NASCET criteria, using the distal internal carotid diameter as the denominator.  CONTRAST:  50mL OMNIPAQUE IOHEXOL 350 MG/ML SOLN  COMPARISON:  None.  FINDINGS: CT HEAD  There is no acute intracranial hemorrhage or infarct. No mass lesion or midline shift. Gray-white matter differentiation is well maintained. Ventricles are normal in size without evidence of hydrocephalus. CSF containing spaces are within normal limits. No extra-axial fluid collection.  The calvarium is intact.  Orbital soft tissues are within normal limits.  The paranasal sinuses and mastoid air cells are well pneumatized and free of fluid.  Scalp soft tissues are unremarkable.  CTA NECK  Aortic arch: The visualized aortic arch is of normal caliber with normal 3 vessel morphology. No high-grade stenosis seen at the origin of the great vessels. Visualized subclavian arteries widely patent.  Right carotid system: Right common carotid artery well opacified to the carotid bifurcation. Right internal carotid artery widely patent to the skullbase. No evidence for dissection, occlusion, or hemodynamically significant stenosis.  Left carotid system: Left common carotid artery well opacified from its origin to the carotid bifurcation. Left internal carotid artery widely patent to the skullbase. No evidence for dissection, hemodynamically significant stenosis, or occlusion.   Vertebral arteries:Both vertebral arteries arise from the subclavian arteries. Left vertebral artery is slightly dominant. Both vertebral arteries well opacified and widely patent along their entire course without evidence for dissection or significant stenosis.  Skeleton: No acute osseous abnormality. No worrisome lytic or blastic osseous lesions. 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  Other neck: Visualized lungs are clear. There is a 3 mm nodule within the right upper lobe (series 501, image 17). Thyroid gland unremarkable. No soft tissue abnormality within the neck. No adenopathy.  CTA HEAD  Anterior circulation: The petrous, cavernous, and supraclinoid segments of the internal carotid arteries are widely patent bilaterally. The left ICA is somewhat small as compared to the right, likely due to a somewhat hypoplastic left A1 segment. The left A1 segment is patent without significant stenosis. Right A1 segment widely patent. Anterior communicating artery and anterior cerebral arteries well opacified.  M1 segments widely patent without proximal branch occlusion or stenosis. Distal MCA branches well opacified bilaterally.  Posterior circulation: Both vertebral arteries well opacified to the vertebrobasilar junction. Posterior inferior cerebral arteries well opacified. Basilar artery widely patent. No basilar tip itself is mildly ectatic without focal aneurysm. Superior cerebellar arteries patent bilaterally. Posterior cerebral arteries well opacified bilaterally.  Venous sinuses: No  abnormality identified within the venous sinuses.  Anatomic variants: No aneurysm or vascular malformation.  Delayed phase: No abnormal enhancement on delayed sequence.  IMPRESSION: 1. No acute intracranial process identified. 2. Unremarkable CTA of the head and neck. 3. 3 mm right upper lobe pulmonary nodule, indeterminate. If the patient is at high risk for bronchogenic carcinoma, follow-up chest CT at 1 year is  recommended. If the patient is at low risk, no follow-up is needed. This recommendation follows the consensus statement: Guidelines for Management of Small Pulmonary Nodules Detected on CT Scans: A Statement from the Fleischner Society as published in Radiology 2005; 237:395-400.   Electronically Signed   By: Rise Mu M.D.   On: 09/30/2014 23:36   Ct Angio Neck W/cm &/or Wo/cm  09/30/2014   EXAM: CT ANGIOGRAPHY HEAD AND NECK  TECHNIQUE: Multidetector CT imaging of the head and neck was performed using the standard protocol during bolus administration of intravenous contrast. Multiplanar CT image reconstructions and MIPs were obtained to evaluate the vascular anatomy. Carotid stenosis measurements (when applicable) are obtained utilizing NASCET criteria, using the distal internal carotid diameter as the denominator.  CONTRAST:  50mL OMNIPAQUE IOHEXOL 350 MG/ML SOLN  COMPARISON:  None.  FINDINGS: CT HEAD  There is no acute intracranial hemorrhage or infarct. No mass lesion or midline shift. Gray-white matter differentiation is well maintained. Ventricles are normal in size without evidence of hydrocephalus. CSF containing spaces are within normal limits. No extra-axial fluid collection.  The calvarium is intact.  Orbital soft tissues are within normal limits.  The paranasal sinuses and mastoid air cells are well pneumatized and free of fluid.  Scalp soft tissues are unremarkable.  CTA NECK  Aortic arch: The visualized aortic arch is of normal caliber with normal 3 vessel morphology. No high-grade stenosis seen at the origin of the great vessels. Visualized subclavian arteries widely patent.  Right carotid system: Right common carotid artery well opacified to the carotid bifurcation. Right internal carotid artery widely patent to the skullbase. No evidence for dissection, occlusion, or hemodynamically significant stenosis.  Left carotid system: Left common carotid artery well opacified from its origin to  the carotid bifurcation. Left internal carotid artery widely patent to the skullbase. No evidence for dissection, hemodynamically significant stenosis, or occlusion.  Vertebral arteries:Both vertebral arteries arise from the subclavian arteries. Left vertebral artery is slightly dominant. Both vertebral arteries well opacified and widely patent along their entire course without evidence for dissection or significant stenosis.  Skeleton: No acute osseous abnormality. No worrisome lytic or blastic osseous lesions. 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  Other neck: Visualized lungs are clear. There is a 3 mm nodule within the right upper lobe (series 501, image 17). Thyroid gland unremarkable. No soft tissue abnormality within the neck. No adenopathy.  CTA HEAD  Anterior circulation: The petrous, cavernous, and supraclinoid segments of the internal carotid arteries are widely patent bilaterally. The left ICA is somewhat small as compared to the right, likely due to a somewhat hypoplastic left A1 segment. The left A1 segment is patent without significant stenosis. Right A1 segment widely patent. Anterior communicating artery and anterior cerebral arteries well opacified.  M1 segments widely patent without proximal branch occlusion or stenosis. Distal MCA branches well opacified bilaterally.  Posterior circulation: Both vertebral arteries well opacified to the vertebrobasilar junction. Posterior inferior cerebral arteries well opacified. Basilar artery widely patent. No basilar tip itself is mildly ectatic without focal aneurysm. Superior cerebellar arteries patent bilaterally.  Posterior cerebral arteries well opacified bilaterally.  Venous sinuses: No abnormality identified within the venous sinuses.  Anatomic variants: No aneurysm or vascular malformation.  Delayed phase: No abnormal enhancement on delayed sequence.  IMPRESSION: 1. No acute intracranial process identified. 2. Unremarkable CTA of  the head and neck. 3. 3 mm right upper lobe pulmonary nodule, indeterminate. If the patient is at high risk for bronchogenic carcinoma, follow-up chest CT at 1 year is recommended. If the patient is at low risk, no follow-up is needed. This recommendation follows the consensus statement: Guidelines for Management of Small Pulmonary Nodules Detected on CT Scans: A Statement from the Fleischner Society as published in Radiology 2005; 237:395-400.   Electronically Signed   By: Rise MuBenjamin  McClintock M.D.   On: 09/30/2014 23:36     EKG Interpretation None      MDM   Final diagnoses:  Headache  Lung nodule    No acute etiology for patient's headache. Could be migraine variant given chronicity and one-sided symptoms. Given her dizziness and neck pain is CT image was obtained and is negative. I did discuss the lung nodule with the patient and recommended she get a PCP for follow-up. She is well-appearing here and has a normal neurologic exam. Minimal pain at this time. Stable for discharge.    Pricilla LovelessScott Bishop Vanderwerf, MD 10/01/14 [phone]

## 2014-10-01 NOTE — Discharge Instructions (Signed)
°Emergency Department Resource Guide °1) Find a Doctor and Pay Out of Pocket °Although you won't have to find out who is covered by your insurance plan, it is a good idea to ask around and get recommendations. You will then need to call the office and see if the doctor you have chosen will accept you as a new patient and what types of options they offer for patients who are self-pay. Some doctors offer discounts or will set up payment plans for their patients who do not have insurance, but you will need to ask so you aren't surprised when you get to your appointment. ° °2) Contact Your Local Health Department °Not all health departments have doctors that can see patients for sick visits, but many do, so it is worth a call to see if yours does. If you don't know where your local health department is, you can check in your phone book. The CDC also has a tool to help you locate your state's health department, and many state websites also have listings of all of their local health departments. ° °3) Find a Walk-in Clinic °If your illness is not likely to be very severe or complicated, you may want to try a walk in clinic. These are popping up all over the country in pharmacies, drugstores, and shopping centers. They're usually staffed by nurse practitioners or physician assistants that have been trained to treat common illnesses and complaints. They're usually fairly quick and inexpensive. However, if you have serious medical issues or chronic medical problems, these are probably not your best option. ° °No Primary Care Doctor: °- Call [facility] at  832-8000 - they can help you locate a primary care doctor that  accepts your insurance, provides certain services, etc. °- Physician Referral Service- 1-800-533-3463 ° °Chronic Pain Problems: °Organization         Address  Phone   Notes  °[facility]  (336) 297-2271 Patients need to be referred by their primary care doctor.  ° °Medication  Assistance: °Organization         Address  Phone   Notes  °[place] [facility] [address] °[place], [place] [address] (336) 641-8030 --Must be a resident of [place] °-- Must have NO insurance coverage whatsoever (no Medicaid/ Medicare, etc.) °-- The pt. MUST have a primary care doctor that directs their care regularly and follows them in the community °  °[facility]  (866) 331-1348   °[employer]  (888) 892-1162   ° °Agencies that provide inexpensive medical care: °Organization         Address  Phone   Notes  °[facility]  (336) 832-8035   °[facility]    (336) 832-7272   °[facility] [address] °[place], [place] [address] (336) 832-4777   °[facility] [address], °[place] (336) 271-4999   °[facility]    (336) 373-0678   °[facility]    (336) 272-1050   °[facility] ° [address], [place] Phone:  (336) 832-4444, Fax:  (336) 832-4440 Hours of Operation:  9 am - 6 pm, M-F.  Also accepts Medicaid/Medicare and self-pay.  °[facility] ° [address], [place] Phone: (336) 832-3150, Fax: (336) [phone]. Hours of Operation:  8:30 am - 5:30 pm, M-F.  Also accepts Medicaid and self-pay.  °[facility] [place] [address], [place] Phone: (336) 878-6027   °[facility] [address], [place], [place] (336)723-1848, Ext. 123 Mondays & Thursdays: 7-9 AM.  First 15 patients are seen on a first come, first serve basis. °  ° °Medicaid-accepting [place] Providers: ° °Organization         Address  Phone   Notes  °[facility] [address], [place] (336) 641-2100 Also accepts self-pay patients.  °[facility] [address], [place] ° (336) 856-9996   °[facility] [address], [place]  (336) 288-8857   °[facility] [address] [place] [address], [place] (336) 299-7000   °Veita Bland [address], [place]  ° (336) 373-1557 Only accepts [place] Access Medicaid patients after they have their name applied to their card.  ° °Self-Pay (no insurance) in [place]: ° °Organization         Address  Phone   Notes  °Sickle Cell Patients, [facility] [address], [place] (336) 832-1970   °[facility] [address], [place] (336) 832-4400   °[facility] ° [address], [place] (336) 992-4800   °[facility]/Dr. Osei-Bonsu ° [address] [place] [address], [place] or [address], [place] (336) 841-8500 Phone number for both [place] and [place] locations is the same.  °[facility] [address], [place] (336) 299-0000   °[facility] [address] [place] [address], [place] or [address] (336) 852-7530 °(336) 878-2260   °[facility] [address], [place] (336) 350-1642, phone; (336) 294-5005, fax Sees patients 1st and 3rd Saturday of every month.  Must not qualify for public or private insurance (i.e. Medicaid, Medicare, [place] Health Choice, Veterans' Benefits) • Household income should be no more than 200% of the poverty level •The clinic cannot treat you if you are pregnant or think you are pregnant • Sexually transmitted diseases are not treated at the clinic.  ° ° °Dental Care: °Organization         Address  Phone  Notes  °[place] [facility] [address], [place] (336) 641-6152 Accepts children up to age 21 who are enrolled in Medicaid or [place] Health Choice; pregnant women with a Medicaid card; and children who have applied for Medicaid or [place] Health Choice, but were declined, whose parents can pay a reduced fee at time of service.  °[place]  [facility] [place]  [address], [place] (336) 641-7733 Accepts children up to age 21 who are enrolled in Medicaid or  Health Choice; pregnant women with a Medicaid card; and children who have applied for Medicaid or  Health Choice, but were declined, whose parents can pay a reduced fee at time of service.  °[facility] ° [address],  (336) 641-4533 Patients are seen by appointment only. Walk-ins are not accepted. [facility] will see patients 18 years of age and older. °Monday - Tuesday (8am-5pm) °Most Wednesdays (8:30-5pm) °$30 per visit, cash only  °[facility] ° [address], [place] (336) 641-4533 Patients are seen by appointment only. Walk-ins are not accepted. [facility] will see patients 18 years of age and older. °One   Wednesday Evening (Monthly: Volunteer Based).  $30 per visit, cash only  °[facility]  (919) 537-3737 for adults; Children under age 4, call Graduate Pediatric Dentistry at (919) [phone]. Children aged 4-14, please call (919) [phone] to request a pediatric application. ° Dental services are provided in all areas of dental care including fillings, crowns and bridges, complete and partial dentures, implants, gum treatment, root canals, and extractions. Preventive care is also provided. Treatment is provided to both adults and children. °Patients are selected via a lottery and there is often a waiting list. °  °[facility] [address], °[place] ° (336) 763-8833 [email or web address] °  °[facility] [address], [place], [place] (336)723-1848, Ext. 123 Second and Fourth Thursday of each month, opens at 6:30 AM; Clinic ends at 9 AM.  Patients are seen on a first-come first-served basis, and a limited number are seen during each clinic.  ° °[facility] ° [address], [place], [place] (336) 723-7904    Eligibility Requirements °You must have lived in [place], [place], or [place] counties for at least the last three months. °  You cannot be eligible for state or federal sponsored healthcare insurance, including Veterans Administration, Medicaid, or Medicare. °  You generally cannot be eligible for healthcare insurance through your employer.  °  How to apply: °Eligibility screenings are held every Tuesday and Wednesday afternoon from 1:00 pm until 4:00 pm. You do not need an appointment for the interview!  °[facility] [address], [place], [place] 336-631-2330   °[facility]  336-342-8273   °[facility]  336-703-3100   °[facility]  336-570-6415   ° °Behavioral Health Resources in the Community: °Intensive Outpatient Programs °Organization         Address  Phone  Notes  °[facility] [address], [place], [place] 336-878-6098   °[facility] [address], [place], [place] 336-832-9800   °[facility] [address], [place], [place] ° 336-882-2125   °[facility] [address],  °[place], [place] 1-800-853-5163 or 336-641-4981   °Substance Abuse Resources °Organization         Address  Phone  Notes  °[facility]  336-882-2125   °[facility]  336-784-9470   °[facility]  336-285-9073   °[facility]  336-845-3988   °Residential & Outpatient Substance Abuse Program  1-800-659-3381   °Psychological Services °Organization         Address  Phone  Notes  °[facility]  336- 832-9600   °[facility]  336- 378-7881   °[facility] [address], [place] 1-800-853-5163 or 336-641-4981   ° °Mobile Crisis Teams °Organization         Address  Phone  Notes  °[facility], Mobile Crisis Care Unit  1-877-626-1772   °Assertive °[facility] ° [address]  [place], [place] 336-834-9664   °Sharon DeEsch [address] °[place] 336-554-5454   ° °Self-Help/Support Groups °Organization         Address  Phone             Notes  °[facility] [facility] - variety of support groups  336- 373-1402 Call for more information  °Narcotics Anonymous (NA), [facility] [address], °[place]  2 meetings at this location  ° °  Residential Treatment Programs °Organization         Address  Phone  Notes  °[facility] [address],    °[place]  1-866-801-8205   °[facility] ° [address], [place], [place] 704-293-8524   °[facility] [address], [place] 336-845-3988 Admissions: 8am-3pm M-F  °[facility] [address]    °[place], [place] 336-841-1104   °[facility] [address], [place], [place] 336-379-7146   °[facility] [address]  °[place], [place] 336-285-9073   °[facility] Intensive Outpatient [address], [place], [place] 336-852-3033   °[facility] [address]  °[place], [place] 1-877-615-2722 or 336-784-9470   °[facility] [address] [place], [place] 336-227-7417 Accepts Medicaid  °[facility] [address]  ° [place] 1-800-659-3381 Substance Abuse/Addiction Treatment  ° °[place] Behavioral Health Resources °Organization         Address  Phone  Notes  °[facility]  (888) 581-9988   °Julie Brannon, PhD [address] [place], [place]   (336) 349-5553 or (336) 951-0000   °[facility]   [address] °[place], [place] (336) 349-4454   °[facility] [address], [place], [place] (336) 342-8316 Insurance/Medicaid/sponsorship through [facility]  °[facility] [address]                                    [place], [place] (336) 342-8316 Therapy/tele-psych/case    °[facility] [address]  ° [place], [place] (336) 349-2233    °Dr. Arfeen  (336) 349-4544   °[facility] [place]  [employer] [place] [facility] 1) [address],  °2) [address], [place] °3)  [address], [place] (336) 349-3220 °(336) 342-7768 ° °(336) 342-8140   °[place] [facility] (336) 342-1394 or (336) 342-3537 (After Hours)    ° ° °

## 2014-10-13 LAB — CYTOLOGY - PAP: Pap: NEGATIVE

## 2014-11-10 ENCOUNTER — Ambulatory Visit (HOSPITAL_BASED_OUTPATIENT_CLINIC_OR_DEPARTMENT_OTHER): Payer: Medicaid Other | Admitting: Internal Medicine

## 2014-11-10 ENCOUNTER — Other Ambulatory Visit: Payer: Self-pay

## 2014-11-10 ENCOUNTER — Encounter: Payer: Self-pay | Admitting: Internal Medicine

## 2014-11-10 ENCOUNTER — Ambulatory Visit
Admission: RE | Admit: 2014-11-10 | Discharge: 2014-11-10 | Disposition: A | Discharge status: Home / Self Care | Payer: Medicaid Other | Source: Ambulatory Visit | Attending: Family Medicine | Admitting: Family Medicine

## 2014-11-10 VITALS — BP 128/85 | HR 82 | Temp 97.9°F | Resp 17 | Ht 60.0 in | Wt 146.0 lb

## 2014-11-10 DIAGNOSIS — F17200 Nicotine dependence, unspecified, uncomplicated: Secondary | ICD-10-CM

## 2014-11-10 DIAGNOSIS — M791 Myalgia: Secondary | ICD-10-CM | POA: Insufficient documentation

## 2014-11-10 DIAGNOSIS — Z720 Tobacco use: Secondary | ICD-10-CM

## 2014-11-10 DIAGNOSIS — K219 Gastro-esophageal reflux disease without esophagitis: Secondary | ICD-10-CM

## 2014-11-10 DIAGNOSIS — F17210 Nicotine dependence, cigarettes, uncomplicated: Secondary | ICD-10-CM | POA: Insufficient documentation

## 2014-11-10 DIAGNOSIS — M7918 Myalgia, other site: Secondary | ICD-10-CM

## 2014-11-10 DIAGNOSIS — R079 Chest pain, unspecified: Secondary | ICD-10-CM | POA: Diagnosis present

## 2014-11-10 DIAGNOSIS — R911 Solitary pulmonary nodule: Secondary | ICD-10-CM | POA: Diagnosis not present

## 2014-11-10 MED ORDER — CYCLOBENZAPRINE HCL 5 MG PO TABS: 5.0000 mg | ORAL_TABLET | Freq: Three times a day (TID) | ORAL | Status: DC | PRN

## 2014-11-10 MED ORDER — IBUPROFEN 600 MG PO TABS: 600.0000 mg | ORAL_TABLET | Freq: Three times a day (TID) | ORAL | Status: DC | PRN

## 2014-11-10 MED ORDER — OMEPRAZOLE 20 MG PO CPDR: 20.0000 mg | DELAYED_RELEASE_CAPSULE | Freq: Every day | ORAL | Status: DC

## 2014-11-10 NOTE — Progress Notes (Signed)
Patient ID: Kathy Lowe, female   DOB: 03/19/1966, 49 y.o.   MRN: [id number]  [id number]  [id number]  DOB - 03/19/1966  CC:  Chief Complaint  Patient presents with  . Establish Care       HPI: Kathy Lowe is a 49 y.o. female here today to establish medical care. Patient was seen in the ER for headaches on 3/24. At that time the patient had a CT angiogram which revealed a 3 mm RUL pulmonary nodule. She is concerned about what this means for her. She quit smoking in march after finding out about nodule.   She c/o of chest pain, upper back pain, dizziness, and nausea.Pain described as a throbbing sensation in upper back that radiated to chest for past two months. Denies injury. Sometimes feel like she is having heart palpitations. Denies caffiene use or SOB. When she eats she feels like her stomach swells and she has a lot of heart burn.   No Known Allergies History reviewed. No pertinent past medical history. Current Outpatient Prescriptions on File Prior to Visit  Medication Sig Dispense Refill  . albuterol (PROVENTIL HFA;VENTOLIN HFA) 108 (90 BASE) MCG/ACT inhaler Inhale 1-2 puffs into the lungs every 6 (six) hours as needed for wheezing. (Patient not taking: Reported on 11/10/2014) 1 Inhaler 0  . albuterol (PROVENTIL HFA;VENTOLIN HFA) 108 (90 BASE) MCG/ACT inhaler Inhale 1-2 puffs into the lungs every 6 (six) hours as needed for wheezing. (Patient not taking: Reported on 09/30/2014) 1 Inhaler 0   No current facility-administered medications on file prior to visit.   Family History  Problem Relation Age of Onset  . Cancer Mother    History   Social History  . Marital Status: Legally Separated    Spouse Name: N/A  . Number of Children: N/A  . Years of Education: N/A   Occupational History  . Not on file.   Social History Main Topics  . Smoking status: Current Every Day Smoker -- 0.50 packs/day    Types: Cigarettes  . Smokeless tobacco: Not on file  . Alcohol Use:  Yes     Comment: occasionally  . Drug Use: No  . Sexual Activity: Not on file   Other Topics Concern  . Not on file   Social History Narrative    Review of Systems: See HPI  Objective:   Filed Vitals:   11/10/14 1055  BP: 128/85  Pulse: 82  Temp: 97.9 F (36.6 C)  Resp: 17    Physical Exam: Constitutional: Patient appears well-developed and well-nourished. No distress. HENT: Normocephalic, atraumatic, External right and left ear normal. Oropharynx is clear and moist.  Eyes: Conjunctivae and EOM are normal. PERRLA, no scleral icterus. Neck: Normal ROM. Neck supple. No JVD. No tracheal deviation. No thyromegaly. CVS: RRR, S1/S2 +, no murmurs, no gallops, no carotid bruit.  Pulmonary: Effort and breath sounds normal, no stridor, rhonchi, wheezes, rales.  Abdominal: Soft. BS +, no distension, tenderness, rebound or guarding.  Musculoskeletal: Normal range of motion. No edema and no tenderness.  Lymphadenopathy: No lymphadenopathy noted, cervical, inguinal or axillary Neuro: Alert. Normal reflexes, muscle tone coordination. No cranial nerve deficit. Skin: Skin is warm and dry. No rash noted. Not diaphoretic. No erythema. No pallor. Psychiatric: Normal mood and affect. Behavior, judgment, thought content normal.  Lab Results  Component Value Date   WBC 6.7 09/30/2014   HGB 15.1* 09/30/2014   HCT 44.7 09/30/2014   MCV 93.1 09/30/2014   PLT 301 09/30/2014   Lab Results  Component Value Date   CREATININE 0.74 09/30/2014   BUN 11 09/30/2014   NA 139 09/30/2014   K 3.8 09/30/2014   CL 103 09/30/2014   CO2 23 09/30/2014    No results found for: HGBA1C Lipid Panel  No results found for: CHOL, TRIG, HDL, CHOLHDL, VLDL, LDLCALC     Assessment and plan:   Kathy Lowe was seen today for establish care.  Diagnoses and all orders for this visit:  Musculoskeletal pain Orders: -     cyclobenzaprine (FLEXERIL) 5 MG tablet; Take 1 tablet (5 mg total) by mouth 3 (three)  times daily as needed for muscle spasms. -     ibuprofen (ADVIL,MOTRIN) 600 MG tablet; Take 1 tablet (600 mg total) by mouth every 8 (eight) hours as needed. -     Lipid panel; Future -     COMPLETE METABOLIC PANEL WITH GFR; Future -     TSH; Future EKG: normal EKG, normal sinus rhythm. Reviewed by me   Gastroesophageal reflux disease, esophagitis presence not specified Orders: -     Begin omeprazole (PRILOSEC) 20 MG capsule; Take 1 capsule (20 mg total) by mouth daily. For acid reflux Discussed diet and weight with patient relating to acid reflux.  Went over things that may exacerbate acid reflux such as tomatoes, spicy foods, coffee, carbonated beverages, chocolates, etc.  Advised patient to avoid laying down at least two hours after meals and sleep with HOB elevated.   Pulmonary nodule Since patient is a smoker, I will repeat CT in one year. Went over some causes of lung nodules other than cancer with patient.   Smoker Smoking cessation discussed for 3 minutes, patient is not willing to quit at this time. Will continue to assess on each visit. Discussed increased risk for diseases such as cancer, heart disease, and stroke.   Due to language barrier, an interpreter was present during the history-taking and subsequent discussion (and for part of the physical exam) with this patient.  Return in about 1 day (around 11/11/2014) for Lab Visit and CT in one year.    Holland CommonsKECK, Danel Studzinski, NP-C [facility] [phone] 11/10/2014, 11:22 AM

## 2014-11-10 NOTE — Patient Instructions (Addendum)
Ndulo pulmonar (Pulmonary Nodule) Un ndulo pulmonar es un bulto redondo y pequeo en el tejido del pulmn. Pueden variar en forma desde menos de 1/5 de pulgada (4 mm) a un poco ms de una pulgada (25 mm). La mayora de los ndulos pulmonares se detectan cuando se hacen estudios por imgenes del pulmn por un problema diferente. Los ndulos pulmonares generalmente no son cancerosos (son benignos). Sin embargo, algunos pueden ser Time Warnercancerosos (malignos). El tratamiento o los estudios siguientes se basarn en el tamao del ndulo pulmonar y en el riesgo de contraer cncer de pulmn.  CAUSAS Las causas de los ndulos pulmonares benignos pueden ser Eaglediferentes. Algunas causas son:   Infecciones virales fngicas o bacterianas. En general se trata de una infeccin [place]igua que ya no es [place] Verdeactiva, pero que en algunos casos puede ser Neomia Dearuna infeccin actual y Beverly Milchactiva.  Una masa benigna de tejido.  Inflamacin por enfermedades como la artritis reumatoidea.   Vasos sanguneos anormales en los pulmones. Ndulos pulmonares malignos pueden ser el resultado de un cncer de pulmn o de otro tipo de cncer que se extiende al pulmn desde otros lugares del cuerpo. SIGNOS Y SNTOMAS La mayora de los ndulos pulmonares no causan sntomas. DIAGNSTICO Con ms frecuencia, los ndulos pulmonares se encuentran incidentalmente cuando se toma una radiografa o una tomografa computada para diagnosticar otros problemas en la zona del pulmn. Para ayudar a determinar si un ndulo pulmonar es benigno o Urbanamaligno, el mdico le har una historia clnica e indicar varios estudios. Los estudios pueden incluir:   Anlisis de [place] Mountainsangre.  Una prueba que se realiza en la piel llamada prueba de tuberculina. Esta prueba se B[place]a para determinar si ha estado expuesto al germen que causa la tuberculosis.   Radiografa de trax. Si es posible, una nueva radiografa puede compararse con otras tomadas en el pasado.   Tomografa computada. Este  estudio muestra ndulos pulmonares ms pequeos que una radiografa.   Tomografa por emisin de positrones (PET). En este estudio, una cantidad segura de sustancia radioactiva se inyecta en la sangre. Luego, se toman imgenes del ndulo pulmonar. La sustancia radioactiva es eliminada del organismo a travs de la [place].   Biopsia. Un pequeo trozo de ndulo pulmonar se extirpa de modo que pueda observarse en el microscopio TRATAMIENTO  Los ndulos pulmonares benignos normalmente no requieren tratamiento debido a que generalmente no causan sntomas ni problemas respiratorios. El mdico controlar el ndulo pulmonar con controles realizados con tomografas computadas. La frecuencia con que indicar las radiografas variar segn el tamao del ndulo y los factores de riesgo para el cncer de pulmn. Por ejemplo, ser necesario realizar tomografas con ms frecuencia si el ndulo pulmonar es grande y si tiene antecedentes de fumador y Burkina Fasouna historia familiar de cncer. Otros estudios o biopsias pueden indicarse si alguna de las tomografas de control muestran que el ndulo pulmonar ha aumentado de Scottsmoortamao. INSTRUCCIONES PARA EL CUIDADO EN EL HOGAR  Tome slo medicamentos de venta libre o recetados, segn las indicaciones del mdico.  Cumpla con todas las visitas de control, segn le indique su mdico. SOLICITE ATENCIN MDICA SI:  Tiene dificultad para respirar cuando realiza actividades.   Se siente muy cansado o enfermo.   No tiene ganas de comer.   Pierde peso sin causa.   Siente escalofros, fiebre o sudores nocturnos.  SOLICITE ATENCIN MDICA DE INMEDIATO SI:  No puede respirar o comienza a tener sibilancias.   No puede dejar de toser.   Tose y escupe sangre.  Se siente mareado o como si se fuera a desmayar.   Siente sbitamente un dolor en el pecho.   Tiene fiebre o sntomas persistentes durante ms de 2 - 3 das.   Tiene fiebre y los sntomas empeoran  repentinamente. ASEGRESE DE QUE:  Comprende estas instrucciones.  Controlar su afeccin.  Recibir ayuda de inmediato si no mejora o si empeora. Document Released: 02/25/2013 [facility] Patient Information 2015 [place], [place]. This information is not intended to replace advice given to you by your health care provider. Make sure you discuss any questions you have with your health care provider.

## 2014-11-10 NOTE — Progress Notes (Signed)
Pt is here to establish care. Pt reports having pain in her chest and upper back for about 2 months. Pt has lung nodules that she has questions about them.

## 2014-11-11 ENCOUNTER — Ambulatory Visit: Payer: Medicaid Other | Attending: Internal Medicine

## 2014-11-11 DIAGNOSIS — M7918 Myalgia, other site: Secondary | ICD-10-CM

## 2014-11-11 LAB — COMPLETE METABOLIC PANEL WITH GFR
ALK PHOS: 116 U/L (ref 39–117)
ALT: 28 U/L (ref 0–35)
AST: 27 U/L (ref 0–37)
Albumin: 4.1 g/dL (ref 3.5–5.2)
BILIRUBIN TOTAL: 0.4 mg/dL (ref 0.2–1.2)
BUN: 14 mg/dL (ref 6–23)
CO2: 27 meq/L (ref 19–32)
Calcium: 9 mg/dL (ref 8.4–10.5)
Chloride: 103 mEq/L (ref 96–112)
Creat: 0.62 mg/dL (ref 0.50–1.10)
GFR, Est Non African American: >89 mL/min
Glucose, Bld: 96 mg/dL (ref 70–99)
Potassium: 4.1 mEq/L (ref 3.5–5.3)
Sodium: 141 mEq/L (ref 135–145)
TOTAL PROTEIN: 6.7 g/dL (ref 6.0–8.3)

## 2014-11-11 LAB — LIPID PANEL
CHOLESTEROL: 179 mg/dL (ref 0–200)
HDL: 53 mg/dL (ref >=46)
LDL CALC: 100 mg/dL — AB (ref 0–99)
Total CHOL/HDL Ratio: 3.4 Ratio
Triglycerides: 132 mg/dL (ref <150)
VLDL: 26 mg/dL (ref 0–40)

## 2014-11-12 LAB — TSH: TSH: 1.623 u[IU]/mL (ref 0.350–4.500)

## 2015-10-25 ENCOUNTER — Encounter: Payer: Self-pay | Admitting: Sports Medicine

## 2015-10-25 ENCOUNTER — Ambulatory Visit (INDEPENDENT_AMBULATORY_CARE_PROVIDER_SITE_OTHER): Payer: Medicaid Other | Admitting: Sports Medicine

## 2015-10-25 DIAGNOSIS — M79674 Pain in right toe(s): Secondary | ICD-10-CM

## 2015-10-25 DIAGNOSIS — B351 Tinea unguium: Secondary | ICD-10-CM

## 2015-10-25 DIAGNOSIS — M79675 Pain in left toe(s): Secondary | ICD-10-CM

## 2015-10-25 NOTE — Progress Notes (Signed)
Patient ID: Kathy Lowe, female   DOB: 03/19/1966, 50 y.o.   MRN: [id number] Subjective: Kathy GreenlandRosario Brashears is a 50 y.o. female patient seen today in office with complaint of painful thickened and discolored nails. Patient is desiring treatment for nail changes; has tried OTC topical in the past with no improvement. Reports that nails are becoming difficult to manage because of the thickness and are tender. Denies trauma history. Patient has no other pedal complaints at this time.   Patient is assisted by daughter who is translating.   Patient Active Problem List   Diagnosis Date Noted  . Smoker 11/10/2014    Current Outpatient Prescriptions on File Prior to Visit  Medication Sig Dispense Refill  . cyclobenzaprine (FLEXERIL) 5 MG tablet Take 1 tablet (5 mg total) by mouth 3 (three) times daily as needed for muscle spasms. 30 tablet 1  . ibuprofen (ADVIL,MOTRIN) 600 MG tablet Take 1 tablet (600 mg total) by mouth every 8 (eight) hours as needed. 30 tablet 1  . omeprazole (PRILOSEC) 20 MG capsule Take 1 capsule (20 mg total) by mouth daily. For acid reflux 30 capsule 3   No current facility-administered medications on file prior to visit.    No Known Allergies  Objective: Physical Exam  General: Well developed, nourished, no acute distress, awake, alert and oriented x 3  Vascular: Dorsalis pedis artery 2/4 bilateral, Posterior tibial artery 1/4 bilateral, skin temperature warm to warm proximal to distal bilateral lower extremities, no varicosities, pedal hair present bilateral.  Neurological: Gross sensation present via light touch bilateral.   Dermatological: Skin is warm, dry, and supple bilateral, Nails 1-10 are tender, short thick, and discolored with mild subungal debris with bilateral hallux and 5th toenails most involved, no webspace macerations present bilateral, no open lesions present bilateral, no callus/corns/hyperkeratotic tissue present bilateral. No signs of infection  bilateral.  Musculoskeletal: Mild tenderness to nails. No symptomatic boney deformities noted bilateral. Muscular strength within normal limits without painon range of motion. No pain with calf compression bilateral.  Assessment and Plan:  Problem List Items Addressed This Visit    None    Visit Diagnoses    Dermatophytosis of nail    -  Primary    Relevant Orders    Fungus Culture with Smear    Toe pain, bilateral          -Examined patient -Discussed treatment options for painful dystrophic nails  -[facility] -Patient to return in 4 weeks for follow up evaluation and discussion of fungal culture results or sooner if symptoms worsen.  Asencion Islamitorya Tarek Cravens, DPM

## 2015-11-22 ENCOUNTER — Encounter: Payer: Self-pay | Admitting: Sports Medicine

## 2015-11-22 ENCOUNTER — Ambulatory Visit (INDEPENDENT_AMBULATORY_CARE_PROVIDER_SITE_OTHER): Payer: Medicaid Other | Admitting: Sports Medicine

## 2015-11-22 DIAGNOSIS — M79675 Pain in left toe(s): Secondary | ICD-10-CM | POA: Diagnosis not present

## 2015-11-22 DIAGNOSIS — B351 Tinea unguium: Secondary | ICD-10-CM

## 2015-11-22 DIAGNOSIS — M79674 Pain in right toe(s): Secondary | ICD-10-CM | POA: Diagnosis not present

## 2015-11-22 NOTE — Progress Notes (Signed)
Patient ID: Kathy Lowe, female   DOB: 03/19/1966, 50 y.o.   MRN: [id number] Subjective: Kathy Lowe is a 50 y.o. female patient seen today in office for fungal culture results. Patient has no other pedal complaints at this time.   Patient is assisted by daughter and interpreter at this visit.   Patient Active Problem List   Diagnosis Date Noted  . Smoker 11/10/2014    Current Outpatient Prescriptions on File Prior to Visit  Medication Sig Dispense Refill  . cyclobenzaprine (FLEXERIL) 5 MG tablet Take 1 tablet (5 mg total) by mouth 3 (three) times daily as needed for muscle spasms. 30 tablet 1  . ibuprofen (ADVIL,MOTRIN) 600 MG tablet Take 1 tablet (600 mg total) by mouth every 8 (eight) hours as needed. 30 tablet 1  . omeprazole (PRILOSEC) 20 MG capsule Take 1 capsule (20 mg total) by mouth daily. For acid reflux 30 capsule 3   No current facility-administered medications on file prior to visit.    No Known Allergies  Objective: Physical Exam  General: Well developed, nourished, no acute distress, awake, alert and oriented x 3  Vascular: Dorsalis pedis artery 2/4 bilateral, Posterior tibial artery 1/4 bilateral, skin temperature warm to warm proximal to distal bilateral lower extremities, no varicosities, pedal hair present bilateral.  Neurological: Gross sensation present via light touch bilateral.   Dermatological: Skin is warm, dry, and supple bilateral, Nails 1-10 are tender, short thick, and discolored with mild subungal debris, bilateral hallux and 5th toes most involved, no webspace macerations present bilateral, no open lesions present bilateral, no callus/corns/hyperkeratotic tissue present bilateral. No signs of infection bilateral.  Musculoskeletal: No symptomatic boney deformities noted bilateral. Muscular strength within normal limits without painon range of motion. No pain with calf compression bilateral.  Fungal culture- T. rubrum secondary to  trauma  Assessment and Plan:  Problem List Items Addressed This Visit    None    Visit Diagnoses    Onychomycosis    -  Primary    Relevant Orders    Hepatic Function Panel    CBC with Differential    Basic Metabolic Panel    Toe pain, bilateral          -Examined patient -Discussed treatment options for painful mycotic nails -Patient opt for oral Lamisil with full understanding of medication risks; ordered LFTs for review if within normal limits will proceed with sending Rx to pharmacy for lamisil 250mg  PO daily. Anticipate 12 week course.  -Advised good hygiene habits -Patient to return in 6 weeks for follow up evaluation or sooner if symptoms worsen.  Asencion Islamitorya Emmalee Solivan, DPM

## 2015-11-24 LAB — CBC WITH DIFFERENTIAL/PLATELET
Basophils Absolute: 53 cells/uL (ref 0–200)
Basophils Relative: 1 %
EOS PCT: 3 %
Eosinophils Absolute: 159 cells/uL (ref 15–500)
HEMATOCRIT: 44.1 % (ref 35.0–45.0)
HEMOGLOBIN: 14.5 g/dL (ref 11.7–15.5)
LYMPHS PCT: 47 %
Lymphs Abs: 2491 cells/uL (ref 850–3900)
MCH: 30.5 pg (ref 27.0–33.0)
MCHC: 32.9 g/dL (ref 32.0–36.0)
MCV: 92.8 fL (ref 80.0–100.0)
MONOS PCT: 8 %
MPV: 10 fL (ref 7.5–12.5)
Monocytes Absolute: 424 cells/uL (ref 200–950)
NEUTROS PCT: 41 %
Neutro Abs: 2173 cells/uL (ref 1500–7800)
Platelets: 324 10*3/uL (ref 140–400)
RBC: 4.75 MIL/uL (ref 3.80–5.10)
RDW: 13.5 % (ref 11.0–15.0)
WBC: 5.3 10*3/uL (ref 3.8–10.8)

## 2015-11-25 ENCOUNTER — Telehealth: Payer: Self-pay | Admitting: Sports Medicine

## 2015-11-25 LAB — HEPATIC FUNCTION PANEL
ALBUMIN: 4.1 g/dL (ref 3.6–5.1)
ALT: 48 U/L — AB (ref 6–29)
AST: 41 U/L — AB (ref 10–35)
Alkaline Phosphatase: 107 U/L (ref 33–130)
Bilirubin, Direct: 0.1 mg/dL (ref <=0.2)
Indirect Bilirubin: 0.3 mg/dL (ref 0.2–1.2)
Total Bilirubin: 0.4 mg/dL (ref 0.2–1.2)
Total Protein: 6.9 g/dL (ref 6.1–8.1)

## 2015-11-25 LAB — BASIC METABOLIC PANEL
BUN: 13 mg/dL (ref 7–25)
CALCIUM: 9.1 mg/dL (ref 8.6–10.4)
CO2: 24 mmol/L (ref 20–31)
Chloride: 104 mmol/L (ref 98–110)
Creat: 0.7 mg/dL (ref 0.50–1.05)
GLUCOSE: 120 mg/dL — AB (ref 65–99)
POTASSIUM: 4 mmol/L (ref 3.5–5.3)
Sodium: 140 mmol/L (ref 135–146)

## 2015-11-25 NOTE — Telephone Encounter (Signed)
Called to speak to Kathy Lowe. Her daughter answered to translate the message to her in [place]. Lab blood work reviewed. LFTs elevated. Advised daughter that we will re-check her LFTs in 6 weeks if normal will proceed with PO lamisil. However if not will proceed with topical and have patient to follow up with PCP. Daughter expressed understanding and states that her mom does smoke and drink; I informed daughter that this could be why her LFTs are elevated. Encouraged cessation. Patient to return for re-evaluation in 6 weeks as scheduled.  -Dr. Marylene LandStover

## 2015-11-29 ENCOUNTER — Encounter: Payer: Self-pay | Admitting: Sports Medicine

## 2015-12-22 ENCOUNTER — Encounter: Payer: Self-pay | Admitting: *Deleted

## 2015-12-26 ENCOUNTER — Ambulatory Visit (INDEPENDENT_AMBULATORY_CARE_PROVIDER_SITE_OTHER): Payer: Medicaid Other | Admitting: Obstetrics & Gynecology

## 2015-12-26 ENCOUNTER — Other Ambulatory Visit (HOSPITAL_COMMUNITY)
Admission: RE | Admit: 2015-12-26 | Discharge: 2015-12-26 | Disposition: A | Discharge status: Home / Self Care | Payer: Medicaid Other | Source: Ambulatory Visit | Attending: Obstetrics & Gynecology | Admitting: Obstetrics & Gynecology

## 2015-12-26 ENCOUNTER — Encounter: Payer: Self-pay | Admitting: Obstetrics & Gynecology

## 2015-12-26 VITALS — BP 137/80 | HR 89 | Wt 153.9 lb

## 2015-12-26 DIAGNOSIS — N9489 Other specified conditions associated with female genital organs and menstrual cycle: Secondary | ICD-10-CM | POA: Insufficient documentation

## 2015-12-26 DIAGNOSIS — T839XXA Unspecified complication of genitourinary prosthetic device, implant and graft, initial encounter: Secondary | ICD-10-CM | POA: Insufficient documentation

## 2015-12-26 DIAGNOSIS — T8331XS Breakdown (mechanical) of intrauterine contraceptive device, sequela: Secondary | ICD-10-CM

## 2015-12-26 LAB — POCT URINALYSIS DIP (DEVICE)
BILIRUBIN URINE: NEGATIVE
Glucose, UA: NEGATIVE mg/dL
Ketones, ur: NEGATIVE mg/dL
Leukocytes, UA: NEGATIVE
NITRITE: NEGATIVE
PH: 5.5 (ref 5.0–8.0)
PROTEIN: NEGATIVE mg/dL
Specific Gravity, Urine: 1.02 (ref 1.005–1.030)
Urobilinogen, UA: 0.2 mg/dL (ref 0.0–1.0)

## 2015-12-26 NOTE — Patient Instructions (Signed)
Regrese a la clinica cuando tenga su cita. Si tiene problemas o preguntas, llama a la clinica o vaya a la sala de emergencia al [facility].    

## 2015-12-26 NOTE — Progress Notes (Signed)
CLINIC ENCOUNTER NOTE  History:  50 y.o. 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 Subsequent scan showed it to be embedded in myometrium (see results of scan under Media tab with the referral notes).  2) Endometrial mass: Same scan incidentally noted a heterogenous 1 mm endometrial mass. Patient has been menopausal since 09/2014, no bleeding, no pain, no abnormal vaginal discharge or other concerns.  She is concerned about cancer.  Patient is accompanied by her daughter, who she insisted on using as a Spanish interpreter despite being told that medical interpreters are available at no cost. She signed the refusal of interpreter form.  History reviewed. No pertinent past medical history.  History reviewed. No pertinent past surgical history.  The following portions of the patient's history were reviewed and updated as appropriate: allergies, current medications, past family history, past medical history, past social history, past surgical history and problem list.   Health Maintenance:  Normal pap at [facility]   Review of Systems:  Pertinent items noted in HPI and remainder of comprehensive ROS otherwise negative.  Objective:  Physical Exam BP 137/80 mmHg  Pulse 89  Wt 153 lb 14.4 oz (69.809 kg)  LMP 09/13/2014 CONSTITUTIONAL: Well-developed, well-nourished female in no acute distress.  HENT:  Normocephalic, atraumatic. External right and left ear normal. Oropharynx is clear and moist EYES: Conjunctivae and EOM are normal. Pupils are equal, round, and reactive to light. No scleral icterus.  NECK: Normal range of motion, supple, no masses SKIN: Skin is warm and dry. No rash noted. Not diaphoretic. No erythema. No pallor. NEUROLOGIC: Alert and oriented to person, place, and time. Normal reflexes, muscle tone coordination. No cranial nerve  deficit noted. PSYCHIATRIC: Normal mood and affect. Normal behavior. Normal judgment and thought content. CARDIOVASCULAR: Normal heart rate noted RESPIRATORY: Effort and breath sounds normal, no problems with respiration noted ABDOMEN: Soft, no distention noted.   PELVIC: Normal appearing external genitalia; normal appearing vaginal mucosa and cervix.  No abnormal discharge noted.  Normal uterine size, no other palpable masses, no uterine or adnexal tenderness. MUSCULOSKELETAL: Normal range of motion. No edema noted.  ENDOMETRIAL BIOPSY     The indications for endometrial biopsy were reviewed.   Risks of the biopsy including cramping, bleeding, infection, uterine perforation, inadequate specimen and need for additional procedures  were discussed. The patient states she understands and agrees to undergo procedure today. Consent was signed. Time out was performed.  During the pelvic exam, the cervix was prepped with Betadine. A single-toothed tenaculum was placed on the anterior lip of the cervix to stabilize it. The 3 mm pipelle was introduced into the endometrial cavity without difficulty to a depth of 7cm, and a moderate amount of tissue was obtained and sent to pathology. No endometrial masses were palpated during procedure. The instruments were removed from the patient's vagina. Minimal bleeding from the cervix was noted. The patient tolerated the procedure well. Routine post-procedure instructions were given to the patient.      Assessment & Plan:  1. Endometrial mass Likely fibroid, will follow up pathology results - Surgical pathology  2. Mechanical breakdown of intrauterine contraceptive device, sequela Had one arm of Paragard break off during recent removal at [facility]; embedded arm is in myometrium -> no need for surgical removal. No increased infection risk; patient and daughter reassured.    Routine preventative  health maintenance measures emphasized. Please refer to After Visit Summary  for other counseling recommendations.    Total face-to-face time with patient: 20 minutes. Over 50% of encounter was spent on counseling and coordination of care.   Kathy CollinsUGONNA  Javonnie Illescas, MD, FACOG Attending Obstetrician & Gynecologist, [facility]

## 2015-12-28 ENCOUNTER — Telehealth: Payer: Self-pay | Admitting: General Practice

## 2015-12-28 NOTE — Telephone Encounter (Signed)
Opened in error

## 2016-01-03 ENCOUNTER — Ambulatory Visit: Payer: Medicaid Other | Admitting: Sports Medicine

## 2016-01-04 ENCOUNTER — Telehealth: Payer: Self-pay | Admitting: General Practice

## 2016-01-04 NOTE — Telephone Encounter (Signed)
Patient's endo bx was negative. Called patient with pacific interpreter [phone] & patient's daughter answered- left message with her to have her mother call us back at the clinics for non urgent results. She stated she would

## 2016-01-18 ENCOUNTER — Encounter: Payer: Self-pay | Admitting: General Practice

## 2016-01-18 NOTE — Telephone Encounter (Signed)
Called patient with pacific interpreter [phone], patient's daughter answered stating she wasn't in but would give her mother the message. Will send letter.

## 2016-05-27 ENCOUNTER — Encounter (HOSPITAL_COMMUNITY): Payer: Self-pay | Admitting: *Deleted

## 2016-05-27 ENCOUNTER — Emergency Department (HOSPITAL_COMMUNITY)
Admission: EM | Admit: 2016-05-27 | Discharge: 2016-05-28 | Disposition: A | Discharge status: Home / Self Care | Payer: Medicaid Other | Attending: Emergency Medicine | Admitting: Emergency Medicine

## 2016-05-27 DIAGNOSIS — F17210 Nicotine dependence, cigarettes, uncomplicated: Secondary | ICD-10-CM | POA: Diagnosis not present

## 2016-05-27 DIAGNOSIS — F10120 Alcohol abuse with intoxication, uncomplicated: Secondary | ICD-10-CM | POA: Diagnosis not present

## 2016-05-27 DIAGNOSIS — F10920 Alcohol use, unspecified with intoxication, uncomplicated: Secondary | ICD-10-CM

## 2016-05-27 DIAGNOSIS — Z79899 Other long term (current) drug therapy: Secondary | ICD-10-CM | POA: Diagnosis not present

## 2016-05-27 DIAGNOSIS — R55 Syncope and collapse: Secondary | ICD-10-CM | POA: Diagnosis present

## 2016-05-27 LAB — CBC WITH DIFFERENTIAL/PLATELET
BASOS PCT: 1 %
Basophils Absolute: 0 10*3/uL (ref 0.0–0.1)
Eosinophils Absolute: 0.1 10*3/uL (ref 0.0–0.7)
Eosinophils Relative: 2 %
HEMATOCRIT: 44.3 % (ref 36.0–46.0)
Hemoglobin: 14.8 g/dL (ref 12.0–15.0)
Lymphocytes Relative: 61 %
Lymphs Abs: 2.8 10*3/uL (ref 0.7–4.0)
MCH: 31.8 pg (ref 26.0–34.0)
MCHC: 33.4 g/dL (ref 30.0–36.0)
MCV: 95.3 fL (ref 78.0–100.0)
MONO ABS: 0.3 10*3/uL (ref 0.1–1.0)
MONOS PCT: 6 %
NEUTROS ABS: 1.4 10*3/uL — AB (ref 1.7–7.7)
Neutrophils Relative %: 30 %
Platelets: 284 10*3/uL (ref 150–400)
RBC: 4.65 MIL/uL (ref 3.87–5.11)
RDW: 13.5 % (ref 11.5–15.5)
WBC: 4.7 10*3/uL (ref 4.0–10.5)

## 2016-05-27 LAB — I-STAT CHEM 8, ED
BUN: 5 mg/dL — ABNORMAL LOW (ref 6–20)
CREATININE: 0.8 mg/dL (ref 0.44–1.00)
Calcium, Ion: 1.02 mmol/L — ABNORMAL LOW (ref 1.15–1.40)
Chloride: 111 mmol/L (ref 101–111)
GLUCOSE: 125 mg/dL — AB (ref 65–99)
HEMATOCRIT: 46 % (ref 36.0–46.0)
HEMOGLOBIN: 15.6 g/dL — AB (ref 12.0–15.0)
Potassium: 3.6 mmol/L (ref 3.5–5.1)
Sodium: 147 mmol/L — ABNORMAL HIGH (ref 135–145)
TCO2: 22 mmol/L (ref 0–100)

## 2016-05-27 LAB — I-STAT BETA HCG BLOOD, ED (MC, WL, AP ONLY)

## 2016-05-27 MED ORDER — ONDANSETRON 8 MG PO TBDP
8.0000 mg | ORAL_TABLET | Freq: Once | ORAL | Status: AC
  Administered 2016-05-28: 8 mg via ORAL
  Filled 2016-05-27: qty 1

## 2016-05-27 MED ORDER — THIAMINE HCL 100 MG/ML IJ SOLN
Freq: Once | INTRAVENOUS | Status: AC
  Administered 2016-05-28: via INTRAVENOUS
  Filled 2016-05-27: qty 1000

## 2016-05-27 MED ORDER — GI COCKTAIL ~~LOC~~
30.0000 mL | Freq: Once | ORAL | Status: AC
  Administered 2016-05-28: 30 mL via ORAL
  Filled 2016-05-27: qty 30

## 2016-05-27 NOTE — ED Provider Notes (Addendum)
[facility]-EMERGENCY DEPT Provider Note   CSN: [id number] Arrival date & time: 05/27/16  2250  By signing my name below, I, Kathy Lowe, attest that this documentation has been prepared under the direction and in the presence of physician practitioner, Catia Todorov, MD. Electronically Signed: Linna Darnerussell Lowe, Scribe. 05/27/2016. 11:11 PM.  History   Chief Complaint Chief Complaint  Patient presents with  . Loss of Consciousness    The history is provided by the patient and a relative. No language interpreter was used.  Loss of Consciousness   This is a new problem. The current episode started 1 to 2 hours ago. The problem has not changed since onset.Length of episode of loss of consciousness: unknown length of time. Associated with: drinking alcohol. Pertinent negatives include back pain, chest pain, nausea, slurred speech, vomiting and weakness. Associated symptoms comments: Alcohol intoxication. She has tried nothing for the symptoms. The treatment provided no relief. Her past medical history does not include CVA.     HPI Comments: LEVEL 5 CAVEAT FOR ALCOHOL INTOXICATION Kathy Lowe is a 50 y.o. female BIB EMS who presents to the Emergency Department complaining of syncope occurring shortly PTA. Son reports that pt has had ~ 7 beers in the last 24 hours and has been drinking "non-stop" since yesterday. She states she usually drinks about 6 beers over the weekend. Son reports that pt lost consciousness and collapsed shortly PTA and he assisted her to the couch before she fell. She states she does not know how long she was unconscious. She also reports intermittent CP and associated dizziness for the last month. Pt denies numbness, weakness, nausea, vomiting, or any other associated symptoms.  No past medical history on file.  Patient Active Problem List   Diagnosis Date Noted  . Endometrial mass 12/26/2015  . IUD complication (HCC) 12/26/2015  . Smoker 11/10/2014    No past surgical  history on file.  OB History    No data available       Home Medications    Prior to Admission medications   Medication Sig Start Date End Date Taking? Authorizing Provider  cyclobenzaprine (FLEXERIL) 5 MG tablet Take 1 tablet (5 mg total) by mouth 3 (three) times daily as needed for muscle spasms. 11/10/14   Ambrose FinlandValerie A Keck, NP  ibuprofen (ADVIL,MOTRIN) 600 MG tablet Take 1 tablet (600 mg total) by mouth every 8 (eight) hours as needed. 11/10/14   Ambrose FinlandValerie A Keck, NP  omeprazole (PRILOSEC) 20 MG capsule Take 1 capsule (20 mg total) by mouth daily. For acid reflux 11/10/14   Ambrose FinlandValerie A Keck, NP    Family History Family History  Problem Relation Age of Onset  . Cancer Mother     Social History Social History  Substance Use Topics  . Smoking status: Current Every Day Smoker    Packs/day: 0.50    Types: Cigarettes  . Smokeless tobacco: Not on file  . Alcohol use Yes     Comment: occasionally     Allergies   Patient has no known allergies.   Review of Systems Review of Systems  Unable to perform ROS: Other  Eyes: Negative for photophobia.  Cardiovascular: Positive for syncope. Negative for chest pain.  Gastrointestinal: Negative for nausea and vomiting.  Musculoskeletal: Negative for back pain.  Neurological: Positive for syncope (one episode). Negative for weakness and numbness.  Psychiatric/Behavioral: Negative for dysphoric mood.  All other systems reviewed and are negative.    Physical Exam Updated Vital Signs LMP 09/13/2014 Comment: has  not had a period in 2 years, started on 09/13/14--   SpO2 96%   Physical Exam  Constitutional: She appears well-developed and well-nourished.  Smells of vodka.  HENT:  Head: Normocephalic. Head is without raccoon's eyes and without Battle's sign.  Right Ear: No mastoid tenderness. No hemotympanum.  Left Ear: No mastoid tenderness. No hemotympanum.  Mouth/Throat: Oropharynx is clear and moist. No oropharyngeal exudate.  Eyes:  Conjunctivae and EOM are normal. Pupils are equal, round, and reactive to light. Right eye exhibits no discharge. Left eye exhibits no discharge. No scleral icterus.  Neck: Normal range of motion. Neck supple. No JVD present. No tracheal deviation present.  Trachea is midline. No stridor or carotid bruits.   Cardiovascular: Normal rate, regular rhythm, normal heart sounds and intact distal pulses.   No murmur heard. Pulmonary/Chest: Effort normal and breath sounds normal. No stridor. No respiratory distress. She has no wheezes. She has no rales.  Lungs CTA bilaterally.  Abdominal: Soft. She exhibits no distension. There is no tenderness. There is no rebound and no guarding.  Very gassy throughout.  Musculoskeletal: Normal range of motion. She exhibits no edema or tenderness.  All compartments are soft. No palpable cords. Intact DP pulse. No edema.  Lymphadenopathy:    She has no cervical adenopathy.  Neurological: She is alert. She has normal reflexes. She displays normal reflexes. She exhibits normal muscle tone.  Intact lower DTR's.  Skin: Skin is warm and dry. Capillary refill takes less than 2 seconds.  Psychiatric: She has a normal mood and affect. Her behavior is normal.  Nursing note and vitals reviewed.    ED Treatments / Results   Vitals:   05/27/16 2313 05/28/16 0100  BP: 119/80 118/86  Pulse: 99 79  Resp: 17 15  Temp: 97.4 F (36.3 C)    Results for orders placed or performed during the hospital encounter of 05/27/16  CBC with Differential/Platelet  Result Value Ref Range   WBC 4.7 4.0 - 10.5 K/uL   RBC 4.65 3.87 - 5.11 MIL/uL   Hemoglobin 14.8 12.0 - 15.0 g/dL   HCT 29.5 62.1 - 30.8 %   MCV 95.3 78.0 - 100.0 fL   MCH 31.8 26.0 - 34.0 pg   MCHC 33.4 30.0 - 36.0 g/dL   RDW 65.7 84.6 - 96.2 %   Platelets 284 150 - 400 K/uL   Neutrophils Relative % 30 %   Neutro Abs 1.4 (L) 1.7 - 7.7 K/uL   Lymphocytes Relative 61 %   Lymphs Abs 2.8 0.7 - 4.0 K/uL   Monocytes  Relative 6 %   Monocytes Absolute 0.3 0.1 - 1.0 K/uL   Eosinophils Relative 2 %   Eosinophils Absolute 0.1 0.0 - 0.7 K/uL   Basophils Relative 1 %   Basophils Absolute 0.0 0.0 - 0.1 K/uL  Rapid urine drug screen (hospital performed)  Result Value Ref Range   Opiates NONE DETECTED NONE DETECTED   Cocaine NONE DETECTED NONE DETECTED   Benzodiazepines NONE DETECTED NONE DETECTED   Amphetamines NONE DETECTED NONE DETECTED   Tetrahydrocannabinol NONE DETECTED NONE DETECTED   Barbiturates NONE DETECTED NONE DETECTED  I-Stat Chem 8, ED  Result Value Ref Range   Sodium 147 (H) 135 - 145 mmol/L   Potassium 3.6 3.5 - 5.1 mmol/L   Chloride 111 101 - 111 mmol/L   BUN 5 (L) 6 - 20 mg/dL   Creatinine, Ser 9.52 0.44 - 1.00 mg/dL   Glucose, Bld 841 (H) 65 -  99 mg/dL   Calcium, Ion 1.611.02 (L) 1.15 - 1.40 mmol/L   TCO2 22 0 - 100 mmol/L   Hemoglobin 15.6 (H) 12.0 - 15.0 g/dL   HCT 09.646.0 04.536.0 - 40.946.0 %  I-Stat Beta hCG blood, ED ([facility], [facility], [facility] only)  Result Value Ref Range   I-stat hCG, quantitative <5.0 <5 mIU/mL   Comment 3          I-stat troponin, ED  Result Value Ref Range   Troponin i, poc 0.00 0.00 - 0.08 ng/mL   Comment 3           Dg Chest 2 View  Result Date: 05/28/2016 CLINICAL DATA:  Chest pain EXAM: CHEST  2 VIEW COMPARISON:  Chest radiograph 12/01/2012 FINDINGS: Cardiomediastinal contours are normal. No pneumothorax or pleural effusion. No focal airspace consolidation or pulmonary edema. IMPRESSION: Clear lungs. Electronically Signed   By: Deatra RobinsonKevin  Herman M.D.   On: 05/28/2016 01:26    EKG Interpretation  Date/Time:  Sunday May 27 2016 23:18:34 EST Ventricular Rate:  81 PR Interval:    QRS Duration: 103 QT Interval:  371 QTC Calculation: 431 R Axis:   6 Text Interpretation:  Sinus rhythm Confirmed by [facility]  MD, Jola Critzer ([id number]) on 05/27/2016 11:30:29 PM       Procedures Procedures (including critical care time)  DIAGNOSTIC STUDIES: Oxygen Saturation is 96% on RA,  adequate by my interpretation.    COORDINATION OF CARE: 11:15 PM Discussed treatment plan with pt at bedside and pt agreed to plan.  1:51 AM Patient ambulated upon re-evaluation. No emesis. No further symptoms. Stable for discharge.  Medications Ordered in ED Medications  ondansetron (ZOFRAN-ODT) disintegrating tablet 8 mg (8 mg Oral Given 05/28/16 0004)  sodium chloride 0.9 % 1,000 mL with thiamine 100 mg, folic acid 1 mg, multivitamins adult 10 mL, magnesium sulfate 2 g infusion ( Intravenous New Bag/Given 05/28/16 0004)  gi cocktail (Maalox,Lidocaine,Donnatal) (30 mLs Oral Given 05/28/16 0004)   Orthostatic VS for the past 24 hrs:  BP- Lying Pulse- Lying BP- Sitting Pulse- Sitting BP- Standing at 0 minutes Pulse- Standing at 0 minutes  05/27/16 2326 112/84 93 (!) 138/98 96 150/87 105      Final Clinical Impressions(s) / ED Diagnoses  HEART score 0, risk of MACE is extremely low. I believe this near syncopal episode secondary to alcohol intoxication.  All questions answered to patient's satisfaction. Based on history and exam patient has been appropriately medically screened and emergency conditions excluded. Patient is stable for discharge at this time. Follow up with your PMD for recheck in 2 days and strict return precautions given.   I personally performed the services described in this documentation, which was scribed in my presence. The recorded information has been reviewed and is accurate.      Cy BlamerApril Lakeisha Waldrop, MD 05/28/16 [id number]    Jenasis Straley, MD 05/28/16 [phone]

## 2016-05-27 NOTE — ED Triage Notes (Signed)
Pt's son called EMS with complains of LOC however reported to EMS at their arrival that pt had 7 beers in the past 24hrs and was arguing with son. Pt then collapsed per son who assisted her to the couch at that time. Pt denies LOC with EMS. EMS found pt to be A&Ox4 but is non-english speaking.

## 2016-05-27 NOTE — ED Notes (Signed)
Bed: WA06 Expected date:  Expected time:  Means of arrival:  Comments: syncope

## 2016-05-28 ENCOUNTER — Encounter (HOSPITAL_COMMUNITY): Payer: Self-pay | Admitting: Emergency Medicine

## 2016-05-28 ENCOUNTER — Emergency Department (HOSPITAL_COMMUNITY): Payer: Medicaid Other

## 2016-05-28 LAB — RAPID URINE DRUG SCREEN, HOSP PERFORMED
AMPHETAMINES: NOT DETECTED
BENZODIAZEPINES: NOT DETECTED
Barbiturates: NOT DETECTED
COCAINE: NOT DETECTED
OPIATES: NOT DETECTED
Tetrahydrocannabinol: NOT DETECTED

## 2016-05-28 LAB — I-STAT TROPONIN, ED: TROPONIN I, POC: 0 ng/mL (ref 0.00–0.08)

## 2016-05-28 NOTE — ED Notes (Signed)
Pt ambulated to the restroom with stand by assistance.

## 2016-05-28 NOTE — ED Notes (Signed)
Pt requesting to speak the Dr again.

## 2016-05-28 NOTE — ED Notes (Signed)
This RN went to get the EDP per pt request. When I returned to update pt and family, they had already left. Discharge papers given, but did not sign out.

## 2017-10-28 ENCOUNTER — Ambulatory Visit (HOSPITAL_COMMUNITY)
Admission: EM | Admit: 2017-10-28 | Discharge: 2017-10-28 | Disposition: A | Discharge status: Home / Self Care | Payer: Medicaid Other | Attending: Family Medicine | Admitting: Family Medicine

## 2017-10-28 ENCOUNTER — Encounter (HOSPITAL_COMMUNITY): Payer: Self-pay | Admitting: Family Medicine

## 2017-10-28 DIAGNOSIS — M79672 Pain in left foot: Secondary | ICD-10-CM | POA: Diagnosis not present

## 2017-10-28 MED ORDER — MELOXICAM 7.5 MG PO TABS: 7.5000 mg | ORAL_TABLET | Freq: Every day | ORAL | 0 refills | Status: DC

## 2017-10-28 NOTE — ED Provider Notes (Signed)
[facility]    CSN: [id number] Arrival date & time: 10/28/17  1617     History   Chief Complaint Chief Complaint  Patient presents with  . Foot Pain    HPI Kathy Lowe is a 52 y.o. female.   52 year old female comes in for 57-month history of left heel and medial foot pain.  Pain is at the bottom of the plantar surface.  Denies injury/trauma.  States pain without weightbearing, but worse with movement and weightbearing.  States hard to stand up to walk first thing in the morning, and can improve after walking for a while.  Works as a Dispensing optician, and has to be on her feet for long periods of time.  Usually during work, she wears sandals.  She has not taken anything for it.  Denies history of diabetes.     History reviewed. No pertinent past medical history.  Patient Active Problem List   Diagnosis Date Noted  . Endometrial mass 12/26/2015  . IUD complication (HCC) 12/26/2015  . Smoker 11/10/2014    History reviewed. No pertinent surgical history.  OB History   None      Home Medications    Prior to Admission medications   Medication Sig Start Date End Date Taking? Authorizing Provider  meloxicam (MOBIC) 7.5 MG tablet Take 1 tablet (7.5 mg total) by mouth daily. 10/28/17   Belinda Fisher, PA-C    Family History Family History  Problem Relation Age of Onset  . Cancer Mother     Social History Social History   Tobacco Use  . Smoking status: Current Every Day Smoker    Packs/day: 0.50    Types: Cigarettes  . Smokeless tobacco: Current User  Substance Use Topics  . Alcohol use: Yes    Alcohol/week: 4.2 oz    Types: 7 Cans of beer per week    Comment: occasionally  . Drug use: No     Allergies   Patient has no known allergies.   Review of Systems Review of Systems  Reason unable to perform ROS: See HPI as above.     Physical Exam Triage Vital Signs ED Triage Vitals [10/28/17 1651]  Enc Vitals Group     BP (!) 146/75     Pulse Rate  (!) 104     Resp 18     Temp 98.1 F (36.7 C)     Temp src      SpO2 98 %     Weight      Height      Head Circumference      Peak Flow      Pain Score      Pain Loc      Pain Edu?      Excl. in GC?    No data found.  Updated Vital Signs BP (!) 146/75   Pulse (!) 104   Temp 98.1 F (36.7 C)   Resp 18   LMP 09/13/2014 Comment: has not had a period in 2 years, started on 09/13/14--   SpO2 98%   Physical Exam  Constitutional: She is oriented to person, place, and time. She appears well-developed and well-nourished. No distress.  HENT:  Head: Normocephalic and atraumatic.  Eyes: Pupils are equal, round, and reactive to light. Conjunctivae are normal.  Musculoskeletal:  No swelling, erythema, increased warmth, contusions seen.  No tenderness to palpation of ankle and dorsal foot.  Tenderness to palpation of medial plantar surface, around the arch.  Full range  of motion.  Strength normal and equal bilaterally.  Sensation intact and equal bilaterally.  Pedal pulse 2+ and equal bilaterally.  Neurological: She is alert and oriented to person, place, and time.     UC Treatments / Results  Labs (all labs ordered are listed, but only abnormal results are displayed) Labs Reviewed - No data to display  EKG None Radiology No results found.  Procedures Procedures (including critical care time)  Medications Ordered in UC Medications - No data to display   Initial Impression / Assessment and Plan / UC Course  I have reviewed the triage vital signs and the nursing notes.  Pertinent labs & imaging results that were available during my care of the patient were reviewed by me and considered in my medical decision making (see chart for details).    1224-month history of atraumatic foot pain.  Pain mostly focused at the plantar surface around the arch.  Will treat for plantar fasciitis.  Start Mobic as directed.  Ice compress.  Wear supportive shoes during work.  Patient to follow-up  with PCP/podiatry if symptoms not improving.  Return precautions given.  Patient expresses understanding and agrees to plan.  Final Clinical Impressions(s) / UC Diagnoses   Final diagnoses:  Foot pain, left    ED Discharge Orders        Ordered    meloxicam (MOBIC) 7.5 MG tablet  Daily     10/28/17 1731        Belinda FisherYu, Amy V, PA-C 10/28/17 1737

## 2017-10-28 NOTE — ED Triage Notes (Signed)
Pt here for pain to left heel and lateral part of foot for 3 months,

## 2017-10-28 NOTE — Discharge Instructions (Signed)
Start Mobic as directed.  Wear supportive shoes.  Stretches and ice compress as directed.  Follow-up with PCP/podiatry if symptoms not improving.

## 2020-06-08 ENCOUNTER — Emergency Department (HOSPITAL_COMMUNITY)
Admission: EM | Admit: 2020-06-08 | Discharge: 2020-06-08 | Disposition: A | Discharge status: Home / Self Care | Payer: Medicaid Other | Attending: Emergency Medicine | Admitting: Emergency Medicine

## 2020-06-08 ENCOUNTER — Emergency Department (HOSPITAL_COMMUNITY): Payer: Medicaid Other

## 2020-06-08 DIAGNOSIS — R059 Cough, unspecified: Secondary | ICD-10-CM | POA: Diagnosis present

## 2020-06-08 DIAGNOSIS — M791 Myalgia, unspecified site: Secondary | ICD-10-CM | POA: Diagnosis not present

## 2020-06-08 DIAGNOSIS — Z20822 Contact with and (suspected) exposure to covid-19: Secondary | ICD-10-CM | POA: Diagnosis not present

## 2020-06-08 DIAGNOSIS — R509 Fever, unspecified: Secondary | ICD-10-CM | POA: Diagnosis not present

## 2020-06-08 DIAGNOSIS — K0889 Other specified disorders of teeth and supporting structures: Secondary | ICD-10-CM | POA: Insufficient documentation

## 2020-06-08 DIAGNOSIS — R52 Pain, unspecified: Secondary | ICD-10-CM

## 2020-06-08 DIAGNOSIS — F17210 Nicotine dependence, cigarettes, uncomplicated: Secondary | ICD-10-CM | POA: Diagnosis not present

## 2020-06-08 DIAGNOSIS — R000 Tachycardia, unspecified: Secondary | ICD-10-CM | POA: Insufficient documentation

## 2020-06-08 LAB — CBC WITH DIFFERENTIAL/PLATELET
Abs Immature Granulocytes: 0.04 10*3/uL (ref 0.00–0.07)
Basophils Absolute: 0 10*3/uL (ref 0.0–0.1)
Basophils Relative: 0 %
Eosinophils Absolute: 0 10*3/uL (ref 0.0–0.5)
Eosinophils Relative: 0 %
HCT: 45.6 % (ref 36.0–46.0)
Hemoglobin: 14.9 g/dL (ref 12.0–15.0)
Immature Granulocytes: 0 %
Lymphocytes Relative: 10 %
Lymphs Abs: 1 10*3/uL (ref 0.7–4.0)
MCH: 32.2 pg (ref 26.0–34.0)
MCHC: 32.7 g/dL (ref 30.0–36.0)
MCV: 98.5 fL (ref 80.0–100.0)
Monocytes Absolute: 0.7 10*3/uL (ref 0.1–1.0)
Monocytes Relative: 7 %
Neutro Abs: 9 10*3/uL — ABNORMAL HIGH (ref 1.7–7.7)
Neutrophils Relative %: 83 %
Platelets: 248 10*3/uL (ref 150–400)
RBC: 4.63 MIL/uL (ref 3.87–5.11)
RDW: 12.4 % (ref 11.5–15.5)
WBC: 10.8 10*3/uL — ABNORMAL HIGH (ref 4.0–10.5)
nRBC: 0 % (ref 0.0–0.2)

## 2020-06-08 LAB — BASIC METABOLIC PANEL
Anion gap: 17 — ABNORMAL HIGH (ref 5–15)
BUN: 7 mg/dL (ref 6–20)
CO2: 21 mmol/L — ABNORMAL LOW (ref 22–32)
Calcium: 9.2 mg/dL (ref 8.9–10.3)
Chloride: 99 mmol/L (ref 98–111)
Creatinine, Ser: 0.66 mg/dL (ref 0.44–1.00)
GFR, Estimated: >60 mL/min (ref >60)
Glucose, Bld: 174 mg/dL — ABNORMAL HIGH (ref 70–99)
Potassium: 3.5 mmol/L (ref 3.5–5.1)
Sodium: 137 mmol/L (ref 135–145)

## 2020-06-08 LAB — URINALYSIS, ROUTINE W REFLEX MICROSCOPIC
Bacteria, UA: NONE SEEN
Bilirubin Urine: NEGATIVE
Glucose, UA: NEGATIVE mg/dL
Ketones, ur: 80 mg/dL — AB
Leukocytes,Ua: NEGATIVE
Nitrite: NEGATIVE
Protein, ur: NEGATIVE mg/dL
Specific Gravity, Urine: 1.009 (ref 1.005–1.030)
pH: 6 (ref 5.0–8.0)

## 2020-06-08 LAB — RESP PANEL BY RT-PCR (FLU A&B, COVID) ARPGX2
Influenza A by PCR: NEGATIVE
Influenza B by PCR: NEGATIVE
SARS Coronavirus 2 by RT PCR: NEGATIVE

## 2020-06-08 LAB — TROPONIN I (HIGH SENSITIVITY): Troponin I (High Sensitivity): 4 ng/L (ref <18)

## 2020-06-08 LAB — LACTIC ACID, PLASMA: Lactic Acid, Venous: 1.1 mmol/L (ref 0.5–1.9)

## 2020-06-08 MED ORDER — ACETAMINOPHEN 500 MG PO TABS
1000.0000 mg | ORAL_TABLET | Freq: Once | ORAL | Status: AC
Start: 2020-06-08 — End: 2020-06-08
  Administered 2020-06-08: 1000 mg via ORAL
  Filled 2020-06-08: qty 2

## 2020-06-08 MED ORDER — SODIUM CHLORIDE 0.9 % IV BOLUS
1000.0000 mL | Freq: Once | INTRAVENOUS | Status: AC
  Administered 2020-06-08: 1000 mL via INTRAVENOUS

## 2020-06-08 MED ORDER — PENICILLIN V POTASSIUM 500 MG PO TABS: 500.0000 mg | ORAL_TABLET | Freq: Four times a day (QID) | ORAL | 0 refills | Status: DC

## 2020-06-08 MED ORDER — OXYCODONE-ACETAMINOPHEN 5-325 MG PO TABS
1.0000 | ORAL_TABLET | Freq: Once | ORAL | Status: AC
  Administered 2020-06-08: 1 via ORAL
  Filled 2020-06-08: qty 1

## 2020-06-08 NOTE — ED Provider Notes (Signed)
[facility] EMERGENCY DEPARTMENT Provider Note   CSN: [id number] Arrival date & time: 06/08/20  0136     History Chief Complaint  Patient presents with  . Tachycardic    Kathy Lowe is a 54 y.o. female.  54 year old female presenting to the ED with racing heart rate.  History obtained via language interpreter.  Patient states she has felt "bad" for for 5 days now with intermittent fevers, body aches, and productive cough with white/yellow phlegm.  She is not had any sick contacts or known Covid exposures.  She is unvaccinated against COVID-19.  States heart began racing about 1 hour ago.  She denies any chest pain or shortness of breath.  States she still feels like she has a fever.  Also has some right upper dental pain.  She is not tried any medications for her symptoms at home.  EMS reported tachycardia 120-140 en route, HR 79 on arrival.  No past medical history on file.  Patient Active Problem List   Diagnosis Date Noted  . Endometrial mass 12/26/2015  . IUD complication (HCC) 12/26/2015  . Smoker 11/10/2014    No past surgical history on file.   OB History   No obstetric history on file.     Family History  Problem Relation Age of Onset  . Cancer Mother     Social History   Tobacco Use  . Smoking status: Current Every Day Smoker    Packs/day: 0.50    Types: Cigarettes  . Smokeless tobacco: Current User  Substance Use Topics  . Alcohol use: Yes    Alcohol/week: 7.0 standard drinks    Types: 7 Cans of beer per week    Comment: occasionally  . Drug use: No    Home Medications Prior to Admission medications   Medication Sig Start Date End Date Taking? Authorizing Provider  meloxicam (MOBIC) 7.5 MG tablet Take 1 tablet (7.5 mg total) by mouth daily. 10/28/17   Belinda Fisher, PA-C    Allergies    Patient has no known allergies.  Review of Systems   Review of Systems  Constitutional: Positive for fever.  HENT: Positive for dental  problem.   Respiratory: Positive for cough.   Musculoskeletal: Positive for arthralgias and myalgias.  All other systems reviewed and are negative.   Physical Exam Updated Vital Signs BP (!) 164/85 (BP Location: Right Arm)   Pulse 79   Temp 100 F (37.8 C) (Oral)   Resp 19   LMP 09/13/2014 Comment: has not had a period in 2 years, started on 09/13/14--   SpO2 96%   Physical Exam Vitals and nursing note reviewed.  Constitutional:      Appearance: She is well-developed.     Comments: Appears non-toxic, warm to the touch  HENT:     Head: Normocephalic and atraumatic.     Comments: Teeth largely in fair dentition, right upper molar broken along posterior aspect, surrounding gingiva mildly erythematous without discrete abscess, handling secretions appropriately, no trismus, no facial or neck swelling, normal phonation without stridor Eyes:     Conjunctiva/sclera: Conjunctivae normal.     Pupils: Pupils are equal, round, and reactive to light.  Cardiovascular:     Rate and Rhythm: Normal rate and regular rhythm.     Heart sounds: Normal heart sounds.     Comments: HR 90-102 during exam, sinus Pulmonary:     Effort: Pulmonary effort is normal.     Breath sounds: Normal breath sounds. No wheezing.  Comments: Lungs clear, no cough observed Abdominal:     General: Bowel sounds are normal.     Palpations: Abdomen is soft.  Musculoskeletal:        General: Normal range of motion.     Cervical back: Normal range of motion.  Skin:    General: Skin is warm and dry.  Neurological:     Mental Status: She is alert and oriented to person, place, and time.     ED Results / Procedures / Treatments   Labs (all labs ordered are listed, but only abnormal results are displayed) Labs Reviewed  CBC WITH DIFFERENTIAL/PLATELET - Abnormal; Notable for the following components:      Result Value   WBC 10.8 (*)    Neutro Abs 9.0 (*)    All other components within normal limits  BASIC  METABOLIC PANEL - Abnormal; Notable for the following components:   CO2 21 (*)    Glucose, Bld 174 (*)    Anion gap 17 (*)    All other components within normal limits  RESP PANEL BY RT-PCR (FLU A&B, COVID) ARPGX2  LACTIC ACID, PLASMA  URINALYSIS, ROUTINE W REFLEX MICROSCOPIC  TROPONIN I (HIGH SENSITIVITY)    EKG EKG Interpretation  Date/Time:  Wednesday June 08 2020 01:36:56 EST Ventricular Rate:  113 PR Interval:    QRS Duration: 88 QT Interval:  347 QTC Calculation: 476 R Axis:   19 Text Interpretation: Sinus tachycardia Abnormal R-wave progression, early transition Confirmed by Ross Marcus ([id number]) on 06/08/2020 1:42:28 AM   Radiology DG Chest Port 1 View  Result Date: 06/08/2020 CLINICAL DATA:  Fever and cough with tachycardia EXAM: PORTABLE CHEST 1 VIEW COMPARISON:  05/28/2016 FINDINGS: The heart size and mediastinal contours are within normal limits. Both lungs are clear. The visualized skeletal structures are unremarkable. IMPRESSION: No active disease. Electronically Signed   By: Alcide Clever M.D.   On: 06/08/2020 02:38    Procedures Procedures (including critical care time)  Medications Ordered in ED Medications  oxyCODONE-acetaminophen (PERCOCET/ROXICET) 5-325 MG per tablet 1 tablet (has no administration in time range)  sodium chloride 0.9 % bolus 1,000 mL (0 mLs Intravenous Stopped 06/08/20 0627)  acetaminophen (TYLENOL) tablet 1,000 mg (1,000 mg Oral Given 06/08/20 0232)    ED Course  I have reviewed the triage vital signs and the nursing notes.  Pertinent labs & imaging results that were available during my care of the patient were reviewed by me and considered in my medical decision making (see chart for details).    MDM Rules/Calculators/A&P  54 y.o. F here with 5 days of URI symptoms-- productive cough, body aches, and intermittent fevers.  No sick contacts or covid exposures, she is unvaccinated.  She is febrile here to 100F, warm to the touch  but overall non-toxic.  Minimal tachycardia on exam, HR varying between 90-102 on exam, sinus.  Lungs grossly clear without wheezes or rhonchi.  She denies chest pain or SOB.  Suspect viral process.  Will check labs, CXR, covid screen, UA.  Patient also with what appears to be infected right upper molar, will need treatment for this and dental follow-up.  Given IVF, tylenol for fever.  Labs thus far reassuring.  Normal lactate, WBC count 10.8.  No significant electrolyte derangement.  Chest x-ray is clear.  Covid screen is negative.  Patient reassessed-- HR remains around 100 on re-checks.  Denies current palpitations.  Remains without chest pain or SOB. Doubt ACS, PE.  States she still just  has a lot of pain in her molar and think that is why her HR was high earlier.  She has an appointment with dentist today at 11am.  IVF infusing. Will give dose of percocet for pain.  UA pending.  If this is negative I feel she can be safely discharged with treatment for possible dental infection and follow-up with her dentist today as scheduled.  Care signed out to oncoming provider.  Will follow-up on UA results and disposition.   Final Clinical Impression(s) / ED Diagnoses Final diagnoses:  Cough  Body aches  Pain, dental    Rx / DC Orders ED Discharge Orders         Ordered    penicillin v potassium (VEETID) 500 MG tablet  4 times daily        06/08/20 0609           Garlon Hatchet, PA-C 06/08/20 9563    Shon Baton, MD 06/08/20 [phone]

## 2020-06-08 NOTE — Discharge Instructions (Addendum)
Labs today looked good-- covid test was negative. I would start antibiotics and take as directed.  Follow-up with your dentist today as scheduled at 11am.

## 2020-06-08 NOTE — ED Triage Notes (Signed)
Pt arrived via [facility] from home after pt started feeling like her heart is racing. EMS states pt was in sinus arrhythmia 120-140's.

## 2022-03-14 ENCOUNTER — Encounter (HOSPITAL_COMMUNITY): Payer: Self-pay

## 2022-03-14 ENCOUNTER — Ambulatory Visit (HOSPITAL_COMMUNITY)
Admission: RE | Admit: 2022-03-14 | Discharge: 2022-03-14 | Disposition: A | Discharge status: Home / Self Care | Payer: Medicaid Other | Source: Ambulatory Visit | Attending: Internal Medicine | Admitting: Internal Medicine

## 2022-03-14 VITALS — BP 158/78 | HR 93 | Temp 97.9°F | Resp 18

## 2022-03-14 DIAGNOSIS — L723 Sebaceous cyst: Secondary | ICD-10-CM | POA: Diagnosis not present

## 2022-03-14 DIAGNOSIS — G4489 Other headache syndrome: Secondary | ICD-10-CM | POA: Insufficient documentation

## 2022-03-14 LAB — CBC WITH DIFFERENTIAL/PLATELET
Abs Immature Granulocytes: 0.01 10*3/uL (ref 0.00–0.07)
Basophils Absolute: 0.1 10*3/uL (ref 0.0–0.1)
Basophils Relative: 1 %
Eosinophils Absolute: 0.1 10*3/uL (ref 0.0–0.5)
Eosinophils Relative: 2 %
HCT: 43 % (ref 36.0–46.0)
Hemoglobin: 14.7 g/dL (ref 12.0–15.0)
Immature Granulocytes: 0 %
Lymphocytes Relative: 33 %
Lymphs Abs: 1.4 10*3/uL (ref 0.7–4.0)
MCH: 32.4 pg (ref 26.0–34.0)
MCHC: 34.2 g/dL (ref 30.0–36.0)
MCV: 94.7 fL (ref 80.0–100.0)
Monocytes Absolute: 0.3 10*3/uL (ref 0.1–1.0)
Monocytes Relative: 7 %
Neutro Abs: 2.5 10*3/uL (ref 1.7–7.7)
Neutrophils Relative %: 57 %
Platelets: 257 10*3/uL (ref 150–400)
RBC: 4.54 MIL/uL (ref 3.87–5.11)
RDW: 13.2 % (ref 11.5–15.5)
WBC: 4.4 10*3/uL (ref 4.0–10.5)
nRBC: 0 % (ref 0.0–0.2)

## 2022-03-14 LAB — COMPREHENSIVE METABOLIC PANEL
ALT: 29 U/L (ref 0–44)
AST: 30 U/L (ref 15–41)
Albumin: 3.6 g/dL (ref 3.5–5.0)
Alkaline Phosphatase: 104 U/L (ref 38–126)
Anion gap: 12 (ref 5–15)
BUN: 11 mg/dL (ref 6–20)
CO2: 25 mmol/L (ref 22–32)
Calcium: 9.2 mg/dL (ref 8.9–10.3)
Chloride: 103 mmol/L (ref 98–111)
Creatinine, Ser: 0.78 mg/dL (ref 0.44–1.00)
GFR, Estimated: >60 mL/min (ref >60)
Glucose, Bld: 258 mg/dL — ABNORMAL HIGH (ref 70–99)
Potassium: 3.5 mmol/L (ref 3.5–5.1)
Sodium: 140 mmol/L (ref 135–145)
Total Bilirubin: 0.9 mg/dL (ref 0.3–1.2)
Total Protein: 6.6 g/dL (ref 6.5–8.1)

## 2022-03-14 NOTE — ED Triage Notes (Signed)
Pt reports a "bump" on the right side of her head after falling and hitting her head 2 years ago. States when the accident occurred it did not cause it any pain but has become painful the past week.  Also endorses headaches x 1 week. States the headaches does not last long. Typically less than 5 minutes.

## 2022-03-14 NOTE — ED Provider Notes (Addendum)
[place]    CSN: [id number] Arrival date & time: 03/14/22  [id number]         History   Chief Complaint Chief Complaint  Patient presents with   Headache   Mass    HPI Kathy Lowe is a 56 y.o. female is accompanied by her daughter today.  Patient comes to urgent care complaining of headache of 4-week duration.  Patient describes the headache as frontal, mild to moderate severity with no known aggravating or relieving factors.  Occasionally NSAIDs relieve the headache.  No early morning nausea or vomiting.  No numbness or tingling.  No speech difficulties.  No fever or chills.  No neck pain.  No trauma to the head or falls.  Patient denies any light sensitivity.  No known aggravating factors.  Patient also complains of a lump on the right forehead.  This started after she sustained a fall a few years ago.  Swelling has been stable over the past 2 years.  No redness or drainage from the swelling.  No changes in vision.  Patient denies any antihypertension medication use  HPI  History reviewed. No pertinent past medical history.  Patient Active Problem List   Diagnosis Date Noted   Endometrial mass [id number]   IUD complication ([place]) XX123456   Smoker 11/10/2014    History reviewed. No pertinent surgical history.  OB History   No obstetric history on file.      Home Medications    Prior to Admission medications   Medication Sig Start Date End Date Taking? Authorizing Provider  meloxicam (MOBIC) 7.5 MG tablet Take 1 tablet (7.5 mg total) by mouth daily. 10/28/17   Tasia Catchings, Amy V, PA-C  penicillin v potassium (VEETID) 500 MG tablet Take 1 tablet (500 mg total) by mouth 4 (four) times daily. 06/08/20   Larene Pickett, PA-C    Family History Family History  Problem Relation Age of Onset   Cancer Mother     Social History Social History   Tobacco Use   Smoking status: Every Day    Packs/day: 0.50    Types: Cigarettes   Smokeless tobacco: Current   Substance Use Topics   Alcohol use: Yes    Alcohol/week: 7.0 standard drinks of alcohol    Types: 7 Cans of beer per week    Comment: occasionally   Drug use: No     Allergies   Patient has no known allergies.   Review of Systems Review of Systems  HENT: Negative.    Respiratory: Negative.    Cardiovascular: Negative.   Gastrointestinal: Negative.   Genitourinary: Negative.   Neurological:  Positive for dizziness and headaches.  Psychiatric/Behavioral: Negative.       Physical Exam Triage Vital Signs ED Triage Vitals  Enc Vitals Group     BP 03/14/22 0951 (!) 158/78     Pulse Rate 03/14/22 0951 93     Resp 03/14/22 0951 18     Temp 03/14/22 0951 97.9 F (36.6 C)     Temp Source 03/14/22 0951 Oral     SpO2 03/14/22 0951 100 %     Weight --      Height --      Head Circumference --      Peak Flow --      Pain Score 03/14/22 0950 4     Pain Loc --      Pain Edu? --      Excl. in GC? --    No  data found.  Updated Vital Signs BP (!) 158/78 (BP Location: Left Arm)   Pulse 93   Temp 97.9 F (36.6 C) (Oral)   Resp 18   LMP 09/13/2014 Comment: has not had a period in 2 years, started on 09/13/14--   SpO2 100%   Visual Acuity Right Eye Distance:   Left Eye Distance:   Bilateral Distance:    Right Eye Near:   Left Eye Near:    Bilateral Near:     Physical Exam Vitals and nursing note reviewed.  Constitutional:      General: She is not in acute distress.    Appearance: She is well-developed. She is not ill-appearing.  Eyes:     General: No visual field deficit. Cardiovascular:     Rate and Rhythm: Normal rate and regular rhythm.  Pulmonary:     Effort: Pulmonary effort is normal.     Breath sounds: Normal breath sounds.  Abdominal:     General: Bowel sounds are normal.     Palpations: Abdomen is soft.  Neurological:     Mental Status: She is alert.     GCS: GCS eye subscore is 4. GCS verbal subscore is 5. GCS motor subscore is 6.     Cranial  Nerves: No cranial nerve deficit, dysarthria or facial asymmetry.     Sensory: No sensory deficit.     Motor: No weakness.     Coordination: Romberg sign negative. Coordination normal.     Gait: Gait normal.  Psychiatric:        Mood and Affect: Mood normal.        Speech: Speech normal.        Behavior: Behavior normal.      UC Treatments / Results  Labs (all labs ordered are listed, but only abnormal results are displayed) Labs Reviewed  CBC WITH DIFFERENTIAL/PLATELET  COMPREHENSIVE METABOLIC PANEL    EKG   Radiology No results found.  Procedures Procedures (including critical care time)  Medications Ordered in UC Medications - No data to display  Initial Impression / Assessment and Plan / UC Course  I have reviewed the triage vital signs and the nursing notes.  Pertinent labs & imaging results that were available during my care of the patient were reviewed by me and considered in my medical decision making (see chart for details).     1.  Headaches: This may be related to elevated blood pressure. Her blood pressure this morning on arrival in the urgent care was 158/78 with a heart rate of 93 Patient is advised to decrease salt intake Exercise for at least 150 minutes a week Patient is advised to monitor her blood pressure at home about 2-3 times a week If her blood pressure readings remain elevated above 140/90 patient will need to be started on antihypertensive agents. CBC, CMP.  2.  Sebaceous cyst over the forehead Cyst is benign If cyst removal or if so desired, patient advised to follow-up with dermatologist or general surgery. Return precautions given.  Final Clinical Impressions(s) / UC Diagnoses   Final diagnoses:  Other headache syndrome  Sebaceous cyst     Discharge Instructions      Please monitor your blood pressure over the next couple of weeks.  You can check your blood pressures 2-3 times a week.  If the blood pressure stays elevated,  please return to urgent care to be reevaluated. The cyst on your forehead is not infected at this time You may see a  dermatologist for cyst removal if it becomes painful. We will call you with recommendations if labs are abnormal. Return to urgent care if you have any other concerns.   ED Prescriptions   None    PDMP not reviewed this encounter.   Merrilee Jansky, MD 03/14/22 1123    Merrilee Jansky, MD 03/14/22 [phone]

## 2022-03-14 NOTE — Discharge Instructions (Addendum)
Please monitor your blood pressure over the next couple of weeks.  You can check your blood pressures 2-3 times a week.  If the blood pressure stays elevated, please return to urgent care to be reevaluated. The cyst on your forehead is not infected at this time You may see a dermatologist for cyst removal if it becomes painful. We will call you with recommendations if labs are abnormal. Return to urgent care if you have any other concerns.

## 2022-03-15 ENCOUNTER — Telehealth (HOSPITAL_COMMUNITY): Payer: Self-pay | Admitting: Emergency Medicine

## 2022-03-15 MED ORDER — METFORMIN HCL 500 MG PO TABS: 500.0000 mg | ORAL_TABLET | Freq: Two times a day (BID) | ORAL | 0 refills | Status: DC

## 2022-03-30 ENCOUNTER — Encounter: Payer: Self-pay | Admitting: Family Medicine

## 2022-03-30 ENCOUNTER — Ambulatory Visit (INDEPENDENT_AMBULATORY_CARE_PROVIDER_SITE_OTHER): Payer: Medicaid Other | Admitting: Family Medicine

## 2022-03-30 VITALS — BP 150/92 | HR 104 | Temp 97.6°F | Ht 63.0 in | Wt 152.5 lb

## 2022-03-30 DIAGNOSIS — E1165 Type 2 diabetes mellitus with hyperglycemia: Secondary | ICD-10-CM

## 2022-03-30 DIAGNOSIS — I10 Essential (primary) hypertension: Secondary | ICD-10-CM

## 2022-03-30 MED ORDER — METFORMIN HCL 500 MG PO TABS: 500.0000 mg | ORAL_TABLET | Freq: Two times a day (BID) | ORAL | 0 refills | Status: DC

## 2022-03-30 MED ORDER — LOSARTAN POTASSIUM 50 MG PO TABS: 50.0000 mg | ORAL_TABLET | Freq: Every day | ORAL | 0 refills | Status: DC

## 2022-03-30 NOTE — Progress Notes (Signed)
New Patient Office Visit  Subjective:  Patient ID: Kathy Lowe, female    DOB: Dec 23, 1965  Age: 56 y.o. MRN: [id number]  CC:  Chief Complaint  Patient presents with   Establish Care    Need new pcp Fasting     HPI-here w/intepreter. Pt can't read Kathy Lowe presents for new pt  HA-went to uc on 03/14/22-did labs, and sugar 258(was also 174 06/08/20).  Bp was 24580 so thought HA from bp.  Started on Metformin then.   2 sis w/DM.  Occ ha.  Occ dizziness Occ L chest pain-when sitting.  No other symptoms.  Lasts few seconds. Sebaceous cyst forehead.    Past Medical History:  Diagnosis Date   Diabetes mellitus without complication (HCC)     History reviewed. No pertinent surgical history.  Family History  Problem Relation Age of Onset   Cancer Sister        "bones".  another sister-uterin   Diabetes Sister     Social History   Socioeconomic History   Marital status: Legally Separated    Spouse name: Not on file   Number of children: 6   Years of education: Not on file   Highest education level: Not on file  Occupational History   Not on file  Tobacco Use   Smoking status: Every Day    Packs/day: 0.50    Types: Cigarettes   Smokeless tobacco: Current  Vaping Use   Vaping Use: Never used  Substance and Sexual Activity   Alcohol use: Yes    Alcohol/week: 7.0 standard drinks of alcohol    Types: 7 Cans of beer per week    Comment: 4-5 beers one day/wk   Drug use: No   Sexual activity: Yes  Other Topics Concern   Not on file  Social History Narrative   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   Social Determinants of Health   Financial Resource Strain: Not on file  Food Insecurity: Not on file  Transportation Needs: Not on file  Physical Activity: Not on file  Stress: Not on file  Social Connections: Not on file  Intimate Partner Violence: Not on file    ROS  ROS: Gen: no fever, chills  Skin: no rash, itching ENT: no ear pain, ear drainage, nasal  congestion, rhinorrhea, sinus pressure, sore throat Eyes: no blurry vision, double vision Resp: no cough, wheeze,SOB CV: no  palpitations, LE edema,  GI: no heartburn, n/v/d/c, abd pain GU: no dysuria, urgency, frequency, hematuria DXI:PJASN legs at times, knees Neuro: no, weakness, vertigo Psych: no depression, anxiety, insomnia, SI   Objective:   Today's Vitals: BP (!) 150/92   Pulse (!) 104   Temp 97.6 F (36.4 C) (Temporal)   Ht 5\' 3"  (1.6 m)   Wt 152 lb 8 oz (69.2 kg)   LMP 09/13/2014 Comment: has not had a period in 2 years, started on 09/13/14--   SpO2 96%   BMI 27.01 kg/m   Physical Exam  Gen: WDWN NAD HEENT: NCAT, conjunctiva not injected, sclera nonicteric NECK:  supple, no thyromegaly, no nodes, no carotid bruits CARDIAC: RRR, S1S2+, no murmur. DP 2+B LUNGS: CTAB. No wheezes ABDOMEN:  BS+, soft, NTND, No HSM, no masses EXT:  no edema MSK: no gross abnormalities.  NEURO: A&O x3.  CN II-XII intact.  PSYCH: normal mood. Good eye contact   Reviewed UC and labs.  Spent 11/13/14 w/pt d/t interpreter, etc.   Assessment & Plan:   Problem List  Items Addressed This Visit       Cardiovascular and Mediastinum   Primary hypertension - Primary   Relevant Medications   losartan (COZAAR) 50 MG tablet     Endocrine   Type 2 diabetes mellitus with hyperglycemia, without long-term current use of insulin (HCC)   Relevant Medications   metFORMIN (GLUCOPHAGE) 500 MG tablet   losartan (COZAAR) 50 MG tablet   Other Relevant Orders   Referral to Nutrition and Diabetes Services   HTN-chronic.  New.  Will start losartan 50mg  daily.  F/u 1 mo and will do labs DM type 2 w/hyperglycemia-new dx.  Just started metformin.  Reviewed labs from UC.  Will refer to DM teaching.  Cont metformin 500mg  bid.  Will check labs in 1 mo when returns as just starting ARB.  A lot of language barriers and pt not read.  Advised on diet and decrease ETOH to 1-2 at a time.   Outpatient Encounter  Medications as of 03/30/2022  Medication Sig   losartan (COZAAR) 50 MG tablet Take 1 tablet (50 mg total) by mouth daily.   [DISCONTINUED] metFORMIN (GLUCOPHAGE) 500 MG tablet Take 1 tablet (500 mg total) by mouth 2 (two) times daily with a meal.   meloxicam (MOBIC) 7.5 MG tablet Take 1 tablet (7.5 mg total) by mouth daily. (Patient not taking: Reported on 03/30/2022)   metFORMIN (GLUCOPHAGE) 500 MG tablet Take 1 tablet (500 mg total) by mouth 2 (two) times daily with a meal.   [DISCONTINUED] penicillin v potassium (VEETID) 500 MG tablet Take 1 tablet (500 mg total) by mouth 4 (four) times daily. (Patient not taking: Reported on 03/30/2022)   No facility-administered encounter medications on file as of 03/30/2022.    Follow-up: Return in about 4 weeks (around 04/27/2022) for dm, htn.   Wellington Hampshire, MD

## 2022-03-30 NOTE — Patient Instructions (Signed)
Welcome to [facility]! It was a pleasure meeting you today.  As discussed, Please schedule a 1 month follow up visit today. Continue metformin, work on diet,  refer nutritionist,  start losartan for blood pressure.   PLEASE NOTE:  If you had any LAB tests please let us know if you have not heard back within a few days. You may see your results on MyChart before we have a chance to review them but we will give you a call once they are reviewed by [place]. If we ordered any REFERRALS today, please let us know if you have not heard from their office within the next week.  Let us know through MyChart if you are needing REFILLS, or have your pharmacy send [place] the request. You can also use MyChart to communicate with me or any office staff.  Please try these tips to maintain a healthy lifestyle:  Eat most of your calories during the day when you are active. Eliminate processed foods including packaged sweets (pies, cakes, cookies), reduce intake of potatoes, white bread, white pasta, and white rice. Look for whole grain options, oat flour or almond flour.  Each meal should contain half fruits/vegetables, one quarter protein, and one quarter carbs (no bigger than a computer mouse).  Cut down on sweet beverages. This includes juice, soda, and sweet tea. Also watch fruit intake, though this is a healthier sweet option, it still contains natural sugar! Limit to 3 servings daily.  Drink at least 1 glass of water with each meal and aim for at least 8 glasses per day  Exercise at least 150 minutes every week.

## 2022-04-30 ENCOUNTER — Ambulatory Visit (INDEPENDENT_AMBULATORY_CARE_PROVIDER_SITE_OTHER): Payer: Medicaid Other | Admitting: Family Medicine

## 2022-04-30 ENCOUNTER — Encounter: Payer: Self-pay | Admitting: Family Medicine

## 2022-04-30 VITALS — BP 148/80 | HR 71 | Temp 97.3°F | Ht 60.0 in | Wt 150.2 lb

## 2022-04-30 DIAGNOSIS — E1165 Type 2 diabetes mellitus with hyperglycemia: Secondary | ICD-10-CM

## 2022-04-30 DIAGNOSIS — I10 Essential (primary) hypertension: Secondary | ICD-10-CM | POA: Diagnosis not present

## 2022-04-30 LAB — CBC WITH DIFFERENTIAL/PLATELET
Basophils Absolute: 0 10*3/uL (ref 0.0–0.1)
Basophils Relative: 0.6 % (ref 0.0–3.0)
Eosinophils Absolute: 0.1 10*3/uL (ref 0.0–0.7)
Eosinophils Relative: 1.8 % (ref 0.0–5.0)
HCT: 44.6 % (ref 36.0–46.0)
Hemoglobin: 15 g/dL (ref 12.0–15.0)
Lymphocytes Relative: 38.1 % (ref 12.0–46.0)
Lymphs Abs: 2 10*3/uL (ref 0.7–4.0)
MCHC: 33.6 g/dL (ref 30.0–36.0)
MCV: 95.4 fl (ref 78.0–100.0)
Monocytes Absolute: 0.5 10*3/uL (ref 0.1–1.0)
Monocytes Relative: 9 % (ref 3.0–12.0)
Neutro Abs: 2.7 10*3/uL (ref 1.4–7.7)
Neutrophils Relative %: 50.5 % (ref 43.0–77.0)
Platelets: 242 10*3/uL (ref 150.0–400.0)
RBC: 4.68 Mil/uL (ref 3.87–5.11)
RDW: 12.7 % (ref 11.5–15.5)
WBC: 5.3 10*3/uL (ref 4.0–10.5)

## 2022-04-30 LAB — LIPID PANEL
Cholesterol: 201 mg/dL — ABNORMAL HIGH (ref 0–200)
HDL: 53.9 mg/dL (ref >39.00)
LDL Cholesterol: 113 mg/dL — ABNORMAL HIGH (ref 0–99)
NonHDL: 147.55
Total CHOL/HDL Ratio: 4
Triglycerides: 173 mg/dL — ABNORMAL HIGH (ref 0.0–149.0)
VLDL: 34.6 mg/dL (ref 0.0–40.0)

## 2022-04-30 LAB — COMPREHENSIVE METABOLIC PANEL
ALT: 29 U/L (ref 0–35)
AST: 34 U/L (ref 0–37)
Albumin: 4.3 g/dL (ref 3.5–5.2)
Alkaline Phosphatase: 114 U/L (ref 39–117)
BUN: 10 mg/dL (ref 6–23)
CO2: 28 mEq/L (ref 19–32)
Calcium: 9.7 mg/dL (ref 8.4–10.5)
Chloride: 103 mEq/L (ref 96–112)
Creatinine, Ser: 0.58 mg/dL (ref 0.40–1.20)
GFR: 100.65 mL/min (ref >60.00)
Glucose, Bld: 163 mg/dL — ABNORMAL HIGH (ref 70–99)
Potassium: 3.7 mEq/L (ref 3.5–5.1)
Sodium: 140 mEq/L (ref 135–145)
Total Bilirubin: 0.7 mg/dL (ref 0.2–1.2)
Total Protein: 7.2 g/dL (ref 6.0–8.3)

## 2022-04-30 LAB — TSH: TSH: 2.62 u[IU]/mL (ref 0.35–5.50)

## 2022-04-30 LAB — HEMOGLOBIN A1C: Hgb A1c MFr Bld: 7.3 % — ABNORMAL HIGH (ref 4.6–6.5)

## 2022-04-30 LAB — VITAMIN B12: Vitamin B-12: 247 pg/mL (ref 211–911)

## 2022-04-30 MED ORDER — LOSARTAN POTASSIUM 100 MG PO TABS: 50.0000 mg | ORAL_TABLET | Freq: Every day | ORAL | 1 refills | Status: DC

## 2022-04-30 MED ORDER — METFORMIN HCL 500 MG PO TABS: 500.0000 mg | ORAL_TABLET | Freq: Two times a day (BID) | ORAL | 0 refills | Status: DC

## 2022-04-30 NOTE — Patient Instructions (Addendum)
It was very nice to see you today!  Increase losartan to 100mg .  Check bp weekly    PLEASE NOTE:  If you had any lab tests please let us know if you have not heard back within a few days. You may see your results on MyChart before we have a chance to review them but we will give you a call once they are reviewed by [place]. If we ordered any referrals today, please let us know if you have not heard from their office within the next week.   Please try these tips to maintain a healthy lifestyle:  Eat most of your calories during the day when you are active. Eliminate processed foods including packaged sweets (pies, cakes, cookies), reduce intake of potatoes, white bread, white pasta, and white rice. Look for whole grain options, oat flour or almond flour.  Each meal should contain half fruits/vegetables, one quarter protein, and one quarter carbs (no bigger than a computer mouse).  Cut down on sweet beverages. This includes juice, soda, and sweet tea. Also watch fruit intake, though this is a healthier sweet option, it still contains natural sugar! Limit to 3 servings daily.  Drink at least 1 glass of water with each meal and aim for at least 8 glasses per day  Exercise at least 150 minutes every week.

## 2022-04-30 NOTE — Progress Notes (Signed)
Subjective:     Patient ID: Kathy Lowe, female    DOB: 1965-10-22, 56 y.o.   MRN: [id number]  Chief Complaint  Patient presents with   Follow-up    4 week follow-up on dm, HTN Fasting     HPI-here w/interpretor Clifton James 1  HTN-Pt is on losartan 50mg   Bp's running-not checking. Has cuff.  No ha/dizziness/palp/edema/cough/sob 2.  DM type 2-on metformin 500bid.  Has appt w/DM teaching Dec 7    Health Maintenance Due  Topic Date Due   HEMOGLOBIN A1C  Never done   FOOT EXAM  Never done   OPHTHALMOLOGY EXAM  Never done   HIV Screening  Never done   Diabetic kidney evaluation - Urine ACR  Never done   COLONOSCOPY (Pts 45-25yrs Insurance coverage will need to be confirmed)  Never done   MAMMOGRAM  Never done   PAP SMEAR-Modifier  10/12/2017    Past Medical History:  Diagnosis Date   Diabetes mellitus without complication ([place])     History reviewed. No pertinent surgical history.  Outpatient Medications Prior to Visit  Medication Sig Dispense Refill   meloxicam (MOBIC) 7.5 MG tablet Take 1 tablet (7.5 mg total) by mouth daily. 15 tablet 0   losartan (COZAAR) 50 MG tablet Take 1 tablet (50 mg total) by mouth daily. 90 tablet 0   metFORMIN (GLUCOPHAGE) 500 MG tablet Take 1 tablet (500 mg total) by mouth 2 (two) times daily with a meal. 180 tablet 0   No facility-administered medications prior to visit.    No Known Allergies ROS neg/noncontributory except as noted HPI/below Occ cp.  Walks 1 hr/day.  Not when active.lasts 1 min and no other symptoms.  Sometimes, when cooking, can feel weak, stressed.       Objective:     BP (!) 148/80   Pulse 71   Temp (!) 97.3 F (36.3 C) (Temporal)   Ht 5' (1.524 m)   Wt 150 lb 4 oz (68.2 kg)   LMP 09/13/2014 Comment: has not had a period in 2 years, started on 09/13/14--   SpO2 97%   BMI 29.34 kg/m  Wt Readings from Last 3 Encounters:  04/30/22 150 lb 4 oz (68.2 kg)  03/30/22 152 lb 8 oz (69.2 kg)  12/26/15 153 lb 14.4 oz  (69.8 kg)    Physical Exam   Gen: WDWN NAD HEENT: NCAT, conjunctiva not injected, sclera nonicteric NECK:  supple, no thyromegaly, no nodes, no carotid bruits CARDIAC: RRR, S1S2+, no murmur. DP 2+B LUNGS: CTAB. No wheezes ABDOMEN:  BS+, soft, NTND, No HSM, no masses EXT:  no edema MSK: no gross abnormalities.  NEURO: A&O x3.  CN II-XII intact.  PSYCH: normal mood. Good eye contact     Assessment & Plan:   Problem List Items Addressed This Visit       Cardiovascular and Mediastinum   Primary hypertension   Relevant Medications   losartan (COZAAR) 100 MG tablet   Other Relevant Orders   Comprehensive metabolic panel   Hemoglobin A1c   Lipid panel   TSH   Vitamin B12   Microalbumin / creatinine urine ratio   CBC with Differential/Platelet     Endocrine   Type 2 diabetes mellitus with hyperglycemia, without long-term current use of insulin ([facility]) - Primary   Relevant Medications   losartan (COZAAR) 100 MG tablet   metFORMIN (GLUCOPHAGE) 500 MG tablet   Other Relevant Orders   Comprehensive metabolic panel   Hemoglobin A1c  Lipid panel   TSH   Vitamin B12   Microalbumin / creatinine urine ratio   CBC with Differential/Platelet   HTN-chronic.  Not controlled.  Inc losartan to 100mg .  Check bp weekly.  F/u 1 mo.  Check cbc,cmp,tsh, urine microalb/creat ratio, lipids DM type 2-chronic.  New dx.  On metformin 500mg  bid-no glucometer yet-will be doing DM teaching in Dec.  Check a1c,cmp,tsh,lipids, microalb/creat ratio, B12 as on metformin  keep working on diet/exercise  Meds ordered this encounter  Medications   losartan (COZAAR) 100 MG tablet    Sig: Take 0.5 tablets (50 mg total) by mouth daily.    Dispense:  90 tablet    Refill:  1   metFORMIN (GLUCOPHAGE) 500 MG tablet    Sig: Take 1 tablet (500 mg total) by mouth 2 (two) times daily with a meal.    Dispense:  180 tablet    Refill:  0    Wellington Hampshire, MD

## 2022-05-01 LAB — MICROALBUMIN / CREATININE URINE RATIO
Creatinine,U: 115.5 mg/dL
Microalb Creat Ratio: 0.6 mg/g (ref 0.0–30.0)
Microalb, Ur: <0.7 mg/dL (ref 0.0–1.9)

## 2022-05-01 NOTE — Progress Notes (Signed)
1.  Sugars are fair.  Continue metformin, working on diet/exercise. 2.  Cholesterol is too high-please send in atorvastatin 20 mg daily #90/0 Everything else looks okay

## 2022-05-02 ENCOUNTER — Other Ambulatory Visit: Payer: Self-pay | Admitting: *Deleted

## 2022-05-02 MED ORDER — ATORVASTATIN CALCIUM 20 MG PO TABS: 20.0000 mg | ORAL_TABLET | Freq: Every day | ORAL | 0 refills | Status: DC

## 2022-05-28 ENCOUNTER — Ambulatory Visit (INDEPENDENT_AMBULATORY_CARE_PROVIDER_SITE_OTHER): Payer: Medicaid Other | Admitting: Family Medicine

## 2022-05-28 ENCOUNTER — Encounter: Payer: Self-pay | Admitting: Family Medicine

## 2022-05-28 VITALS — BP 130/70 | HR 94 | Temp 97.4°F | Ht 60.0 in | Wt 150.2 lb

## 2022-05-28 DIAGNOSIS — I10 Essential (primary) hypertension: Secondary | ICD-10-CM | POA: Diagnosis not present

## 2022-05-28 DIAGNOSIS — Z23 Encounter for immunization: Secondary | ICD-10-CM | POA: Diagnosis not present

## 2022-05-28 DIAGNOSIS — E1165 Type 2 diabetes mellitus with hyperglycemia: Secondary | ICD-10-CM | POA: Diagnosis not present

## 2022-05-28 DIAGNOSIS — E1169 Type 2 diabetes mellitus with other specified complication: Secondary | ICD-10-CM | POA: Diagnosis not present

## 2022-05-28 DIAGNOSIS — E785 Hyperlipidemia, unspecified: Secondary | ICD-10-CM

## 2022-05-28 MED ORDER — KETOCONAZOLE 2 % EX CREA: 1.0000 | TOPICAL_CREAM | Freq: Two times a day (BID) | CUTANEOUS | 1 refills | Status: DC

## 2022-05-28 NOTE — Progress Notes (Signed)
Subjective:     Patient ID: Kathy Lowe, female    DOB: 25-Sep-1965, 56 y.o.   MRN: [id number]  Chief Complaint  Patient presents with   Follow-up    4 week follow-up on HTN and dm Fasting     HPI-here w/interpreter HTN-Pt is on losartan 100mg .  Bp's running not checking.  No ha/dizziness/palp/edema/cough/sob.  Occ cp at rest DM type 2-on metformin 500mg  bid.  No n/t HLD-on atorvastatin 20mg    Health Maintenance Due  Topic Date Due   FOOT EXAM  Never done   OPHTHALMOLOGY EXAM  Never done   HIV Screening  Never done   COLONOSCOPY (Pts 45-43yrs Insurance coverage will need to be confirmed)  Never done   MAMMOGRAM  Never done   PAP SMEAR-Modifier  10/12/2017    Past Medical History:  Diagnosis Date   Diabetes mellitus without complication ([place])     History reviewed. No pertinent surgical history.  Outpatient Medications Prior to Visit  Medication Sig Dispense Refill   atorvastatin (LIPITOR) 20 MG tablet Take 1 tablet (20 mg total) by mouth daily. 90 tablet 0   losartan (COZAAR) 100 MG tablet Take 0.5 tablets (50 mg total) by mouth daily. 90 tablet 1   meloxicam (MOBIC) 7.5 MG tablet Take 1 tablet (7.5 mg total) by mouth daily. 15 tablet 0   metFORMIN (GLUCOPHAGE) 500 MG tablet Take 1 tablet (500 mg total) by mouth 2 (two) times daily with a meal. 180 tablet 0   No facility-administered medications prior to visit.    No Known Allergies ROS neg/noncontributory except as noted HPI/below      Objective:     BP 130/70   Pulse 94   Temp (!) 97.4 F (36.3 C) (Temporal)   Ht 5' (1.524 m)   Wt 150 lb 4 oz (68.2 kg)   LMP 09/13/2014 Comment: has not had a period in 2 years, started on 09/13/14--   SpO2 98%   BMI 29.34 kg/m  Wt Readings from Last 3 Encounters:  05/28/22 150 lb 4 oz (68.2 kg)  04/30/22 150 lb 4 oz (68.2 kg)  03/30/22 152 lb 8 oz (69.2 kg)    Physical Exam   Gen: WDWN NAD HEENT: NCAT, conjunctiva not injected, sclera nonicteric NECK:   supple, no thyromegaly, no nodes, no carotid bruits CARDIAC: RRR, S1S2+, no murmur. DP 2+B LUNGS: CTAB. No wheezes ABDOMEN:  BS+, soft, NTND, No HSM, no masses EXT:  no edema MSK: no gross abnormalities.  NEURO: A&O x3.  CN II-XII intact.  PSYCH: normal mood. Good eye contact Diabetic Foot Exam - Simple   Simple Foot Form Diabetic Foot exam was performed with the following findings: Yes 05/28/2022  8:49 AM  Visual Inspection See comments: Yes Sensation Testing Intact to touch and monofilament testing bilaterally: Yes Pulse Check Posterior Tibialis and Dorsalis pulse intact bilaterally: Yes Comments Fungal nails, peeling skin on feet, callus plantar L near 3rd MCP area         Assessment & Plan:   Problem List Items Addressed This Visit       Cardiovascular and Mediastinum   Primary hypertension     Endocrine   Type 2 diabetes mellitus with hyperglycemia, without long-term current use of insulin ([place]) - Primary   Hyperlipidemia associated with type 2 diabetes mellitus ([place])   Type 2 DM w/hyperglycemia-chronic.  Newer dx.  Not optimal. Sees DM teaching in December. working on diet/exercise.  Continue metformin 500mg  bid.  Discussed foot check.  Ketoconazole 2% cream bid.  F/u 3 mo HTN-chronic. Controlled.  Continue losartan 100mg .   HLD-chronic.  New dx.  Started on atorvastatin 20mg .  Tolerating well.  Continue.  F/u 3 mo and will do lab.   Meds ordered this encounter  Medications   ketoconazole (NIZORAL) 2 % cream    Sig: Apply 1 Application topically 2 (two) times daily.    Dispense:  60 g    Refill:  1    , MD

## 2022-05-28 NOTE — Patient Instructions (Signed)
It was very nice to see you today!  Feliz  dia festivos   PLEASE NOTE:  If you had any lab tests please let us know if you have not heard back within a few days. You may see your results on MyChart before we have a chance to review them but we will give you a call once they are reviewed by [place]. If we ordered any referrals today, please let us know if you have not heard from their office within the next week.   Please try these tips to maintain a healthy lifestyle:  Eat most of your calories during the day when you are active. Eliminate processed foods including packaged sweets (pies, cakes, cookies), reduce intake of potatoes, white bread, white pasta, and white rice. Look for whole grain options, oat flour or almond flour.  Each meal should contain half fruits/vegetables, one quarter protein, and one quarter carbs (no bigger than a computer mouse).  Cut down on sweet beverages. This includes juice, soda, and sweet tea. Also watch fruit intake, though this is a healthier sweet option, it still contains natural sugar! Limit to 3 servings daily.  Drink at least 1 glass of water with each meal and aim for at least 8 glasses per day  Exercise at least 150 minutes every week.

## 2022-06-14 ENCOUNTER — Other Ambulatory Visit: Payer: Self-pay | Admitting: Family Medicine

## 2022-06-14 ENCOUNTER — Encounter: Payer: Medicaid Other | Attending: Family Medicine | Admitting: Dietician

## 2022-06-14 VITALS — Wt 152.0 lb

## 2022-06-14 DIAGNOSIS — E119 Type 2 diabetes mellitus without complications: Secondary | ICD-10-CM | POA: Insufficient documentation

## 2022-06-14 MED ORDER — GLUCOSE BLOOD VI STRP: ORAL_STRIP | 12 refills | Status: AC

## 2022-06-14 MED ORDER — ONETOUCH ULTRASOFT 2 LANCETS MISC: 1.0000 | Freq: Two times a day (BID) | 1 refills | Status: DC | PRN

## 2022-06-14 NOTE — Progress Notes (Signed)
Diabetes Self-Management Education  Visit Type: First/Initial  Appt. Start Time: 1415 (L) Appt. End Time: 1530  06/14/2022  [name], identified by name and date of birth, is a 56 y.o. female with a diagnosis of Diabetes: Type 2.   ASSESSMENT Patient is here today with a Spanish Interpretor from [facility].  Medications reviewed.  Likely taking too much of the Losartan.    Referral today is for Type 2 Diabetes. History includes  Type 2 Diabetes (04/2022) Labs noted to include:  vitamin B-12 247, cholesterol 201, HDL 53, LDL 113, Triglycerides 173, GFR 100 on 05/01/2022, A1C 7.3% 04/30/2022  Meter Provided:  Yes   If Yes, Brand: One Touch Verio Flex Lot #: [id number] x Expiration Date:  09/06/2026 Patient was able to demonstrate use.  Blood glucose was 206 - 2 1/2 hours after lunch MD messaged to request strips and lancets  Patient lives with daughter.  She does the shopping and cooking. She is not working.  No food insecurity.  Uses food stamps. She speaks [place]. She has a 1st grade education and is unable to read [place].  Her daughter reads [place] and therefore handouts need to be in [place]. Gym at her apartment. She does not eat meat often (1-2 times per week).  Weight 152 lb (68.9 kg), last menstrual period 09/13/2014. Body mass index is 29.69 kg/m.   Diabetes Self-Management Education - 06/14/22 1442       Visit Information   Visit Type First/Initial      Initial Visit   Diabetes Type Type 2    Date Diagnosed 04/2022    Are you currently following a meal plan? No    Are you taking your medications as prescribed? Yes      Health Coping   How would you rate your overall health? Fair      Psychosocial Assessment   Patient Belief/Attitude about Diabetes Motivated to manage diabetes    What is the hardest part about your diabetes right now, causing you the most concern, or is the most worrisome to you about your diabetes?   Making healty  food and beverage choices;Being active    Self-care barriers English as a second language;Low literacy    Self-management support Doctor's office    Other persons present Patient;Interpreter    Patient Concerns Nutrition/Meal planning    Special Needs None    Preferred Learning Style No preference indicated    Learning Readiness Ready    How often do you need to have someone help you when you read instructions, pamphlets, or other written materials from your doctor or pharmacy? 1 - Never    What is the last grade level you completed in school? 1st      Pre-Education Assessment   Patient understands the diabetes disease and treatment process. Needs Instruction    Patient understands incorporating nutritional management into lifestyle. Needs Instruction    Patient undertands incorporating physical activity into lifestyle. Needs Instruction    Patient understands using medications safely. Needs Instruction    Patient understands monitoring blood glucose, interpreting and using results Needs Instruction    Patient understands prevention, detection, and treatment of acute complications. Needs Instruction    Patient understands prevention, detection, and treatment of chronic complications. Needs Instruction    Patient understands how to develop strategies to address psychosocial issues. Needs Instruction    Patient understands how to develop strategies to promote health/change behavior. Needs Instruction      Complications   Last  HgB A1C per patient/outside source 7.3 %   04/30/2022   How often do you check your blood sugar? 0 times/day (not testing)    Have you had a dilated eye exam in the past 12 months? No    Have you had a dental exam in the past 12 months? Yes    Are you checking your feet? Yes    How many days per week are you checking your feet? 2      Dietary Intake   Breakfast skips as she wakes late    Snack (morning) none    Lunch rice, potato, salsa   12:00   Snack  (afternoon) apple or banana or other fruit    Dinner cheese quesadilla    Snack (evening) sweet bread    Beverage(s) water, black coffee, occasional regular coke, occasional juice, occasional 1 beer      Activity / Exercise   Activity / Exercise Type Light (walking / raking leaves)   walking, weights, gym   How many days per week do you exercise? 2    How many minutes per day do you exercise? 45    Total minutes per week of exercise 90      Patient Education   Previous Diabetes Education No    Disease Pathophysiology Definition of diabetes, type 1 and 2, and the diagnosis of diabetes    Healthy Eating Plate Method;Meal options for control of blood glucose level and chronic complications.    Being Active Role of exercise on diabetes management, blood pressure control and cardiac health.    Medications Reviewed patients medication for diabetes, action, purpose, timing of dose and side effects.    Monitoring Taught/evaluated SMBG meter.;Identified appropriate SMBG and/or A1C goals.      Individualized Goals (developed by patient)   Nutrition General guidelines for healthy choices and portions discussed    Physical Activity Exercise 5-7 days per week;30 minutes per day    Medications take my medication as prescribed    Monitoring  Test my blood glucose as discussed    Problem Solving Eating Pattern      Post-Education Assessment   Patient understands the diabetes disease and treatment process. Needs Review    Patient understands incorporating nutritional management into lifestyle. Needs Review    Patient undertands incorporating physical activity into lifestyle. Comprehends key points    Patient understands using medications safely. Comphrehends key points    Patient understands monitoring blood glucose, interpreting and using results Comprehends key points    Patient understands prevention, detection, and treatment of acute complications. Needs Instruction    Patient understands  prevention, detection, and treatment of chronic complications. Needs Instruction    Patient understands how to develop strategies to address psychosocial issues. Needs Review    Patient understands how to develop strategies to promote health/change behavior. Needs Review      Outcomes   Expected Outcomes Demonstrated interest in learning. Expect positive outcomes    Future DMSE 2 months    Program Status Not Completed             Individualized Plan for Diabetes Self-Management Training:   Learning Objective:  Patient will have a greater understanding of diabetes self-management. Patient education plan is to attend individual and/or group sessions per assessed needs and concerns.   Plan:   Patient Instructions  Recommend a Vitamin B-12 daily.  Choose one that dissolves in your mouth. Check your blood glucose each morning before you eat.    Fasting  blood glucose goal:  80-130   Blood glucose goal 2 hours after you eat Less than 180.  Bring your meter with you when you see your doctor   Drink water and avoid regular soda and juice  Be active most days for 30 minutes (go to the gym or walk or dance)  1/2 your plate should be vegetables Use little fat. Beans is a great protein choice  Expected Outcomes:  Demonstrated interest in learning. Expect positive outcomes  Education material provided: ADA - How to Thrive: A Guide for Your Journey with Diabetes and My Plate (English and Spanish)  If problems or questions, patient to contact team via:  Phone  Future DSME appointment: 2 months

## 2022-06-14 NOTE — Patient Instructions (Addendum)
Recommend a Vitamin B-12 daily.  Choose one that dissolves in your mouth. Check your blood glucose each morning before you eat.    Fasting blood glucose goal:  80-130   Blood glucose goal 2 hours after you eat Less than 180.  Bring your meter with you when you see your doctor   Drink water and avoid regular soda and juice  Be active most days for 30 minutes (go to the gym or walk or dance)  1/2 your plate should be vegetables Use little fat. Beans is a great protein choice

## 2022-06-15 ENCOUNTER — Ambulatory Visit: Payer: Medicaid Other | Admitting: Dietician

## 2022-07-28 ENCOUNTER — Other Ambulatory Visit: Payer: Self-pay | Admitting: Family Medicine

## 2022-07-28 DIAGNOSIS — E1165 Type 2 diabetes mellitus with hyperglycemia: Secondary | ICD-10-CM

## 2022-08-23 ENCOUNTER — Ambulatory Visit: Payer: Medicaid Other | Admitting: Dietician

## 2022-08-28 ENCOUNTER — Encounter: Payer: Self-pay | Admitting: Family Medicine

## 2022-08-28 ENCOUNTER — Ambulatory Visit (INDEPENDENT_AMBULATORY_CARE_PROVIDER_SITE_OTHER): Payer: Medicaid Other | Admitting: Family Medicine

## 2022-08-28 VITALS — BP 136/72 | HR 82 | Temp 97.7°F | Ht 60.0 in | Wt 153.2 lb

## 2022-08-28 DIAGNOSIS — I10 Essential (primary) hypertension: Secondary | ICD-10-CM

## 2022-08-28 DIAGNOSIS — R1084 Generalized abdominal pain: Secondary | ICD-10-CM

## 2022-08-28 DIAGNOSIS — R0789 Other chest pain: Secondary | ICD-10-CM | POA: Diagnosis not present

## 2022-08-28 DIAGNOSIS — E785 Hyperlipidemia, unspecified: Secondary | ICD-10-CM

## 2022-08-28 DIAGNOSIS — E1165 Type 2 diabetes mellitus with hyperglycemia: Secondary | ICD-10-CM | POA: Diagnosis not present

## 2022-08-28 DIAGNOSIS — E1169 Type 2 diabetes mellitus with other specified complication: Secondary | ICD-10-CM | POA: Diagnosis not present

## 2022-08-28 MED ORDER — METFORMIN HCL 500 MG PO TABS: ORAL_TABLET | ORAL | 0 refills | Status: DC

## 2022-08-28 MED ORDER — OMEPRAZOLE 40 MG PO CPDR: 40.0000 mg | DELAYED_RELEASE_CAPSULE | Freq: Every day | ORAL | 0 refills | Status: DC

## 2022-08-28 MED ORDER — LOSARTAN POTASSIUM 100 MG PO TABS: 100.0000 mg | ORAL_TABLET | Freq: Every day | ORAL | 1 refills | Status: DC

## 2022-08-28 MED ORDER — ATORVASTATIN CALCIUM 20 MG PO TABS: 20.0000 mg | ORAL_TABLET | Freq: Every day | ORAL | 0 refills | Status: DC

## 2022-08-28 NOTE — Patient Instructions (Addendum)
Take B12 vitamin 1021mg/day  buy this over the counter  Take omeprazole daily in morning-for stomach  Continue rest of medications  Reschudule appointment with the nutritionist  Referring to Cardiology

## 2022-08-28 NOTE — Progress Notes (Signed)
Subjective:     Patient ID: Kathy Lowe, female    DOB: March 14, 1966, 57 y.o.   MRN: [id number]  Chief Complaint  Patient presents with   3 month follow-up    3 month follow-up Fasting     HPI-here w/interpreter  DM type 2-on metformin 579m bid.  Saw DM teaching on 12/7.  Working on diet/exercise. Not checking sugars HTN-Pt is on Losartan 1062m  Bp's running can't recall  No ha/dizziness/palp/edema/cough/sob  HLD-on atorvastatin 2057m Started atorvastatin recently.  No SE Occ cp-2 days ago-lying down.  Never when active.  Lasts unk time.    When walking, can feel very tired in body past 1 wk-whole body.  Stops to rest once and then restart and ok.   Health Maintenance Due  Topic Date Due   OPHTHALMOLOGY EXAM  Never done   HIV Screening  Never done   DTaP/Tdap/Td (1 - Tdap) Never done   COLONOSCOPY (Pts 45-49y2yrsurance coverage will need to be confirmed)  Never done   MAMMOGRAM  Never done   PAP SMEAR-Modifier  10/12/2017    Past Medical History:  Diagnosis Date   Diabetes mellitus without complication (HCC)[place]  History reviewed. No pertinent surgical history.  Outpatient Medications Prior to Visit  Medication Sig Dispense Refill   atorvastatin (LIPITOR) 20 MG tablet Take 1 tablet (20 mg total) by mouth daily. 90 tablet 0   glucose blood test strip Use as instructed 100 each 12   ketoconazole (NIZORAL) 2 % cream Apply 1 Application topically 2 (two) times daily. 60 g 1   losartan (COZAAR) 100 MG tablet Take 0.5 tablets (50 mg total) by mouth daily. 90 tablet 1   metFORMIN (GLUCOPHAGE) 500 MG tablet TAKE 1 TABLET(500 MG) BY MOUTH TWICE DAILY WITH A MEAL 180 tablet 0   OneTouch UltraSoft 2 Lancets MISC 1 each by Does not apply route 2 (two) times daily as needed. 100 each 1   meloxicam (MOBIC) 7.5 MG tablet Take 1 tablet (7.5 mg total) by mouth daily. (Patient not taking: Reported on 06/14/2022) 15 tablet 0   No facility-administered medications prior to visit.     No Known Allergies ROS neg/noncontributory except as noted HPI/below Occ lower abd pain-intermitt, not worse after eating.  No v/d.  Can be cooking and feels it.  Not waking her up.  Lasts few minutes.  Can be lying down.no menses.  No dysuria.        Objective:     BP 136/72 (BP Location: Left Arm, Patient Position: Sitting, Cuff Size: Normal)   Pulse 82   Temp 97.7 F (36.5 C) (Temporal)   Ht 5' (1.524 m)   Wt 153 lb 4 oz (69.5 kg)   LMP 09/13/2014 Comment: has not had a period in 2 years, started on 09/13/14--   SpO2 97%   BMI 29.93 kg/m  Wt Readings from Last 3 Encounters:  08/28/22 153 lb 4 oz (69.5 kg)  06/14/22 152 lb (68.9 kg)  05/28/22 150 lb 4 oz (68.2 kg)    Physical Exam   Gen: WDWN NAD HEENT: NCAT, conjunctiva not injected, sclera nonicteric NECK:  supple, no thyromegaly, no nodes, no carotid bruits CARDIAC: RRR, S1S2+, no murmur. DP 2+B LUNGS: CTAB. No wheezes ABDOMEN:  BS+, soft, mildly tender diffusely esp lower abd, No HSM, no masses EXT:  no edema MSK: no gross abnormalities. Tender to palp ant chest wall, lower back/SI area.   NEURO: A&O x3.  CN  II-XII intact.  PSYCH: normal mood. Good eye contact  EKG-NSR.  No acute st changes  Spent 66mn w/pt and interpreter(did not include time doing EKG).getting history, discussing plan, etc.       Assessment & Plan:   Problem List Items Addressed This Visit       Cardiovascular and Mediastinum   Primary hypertension     Endocrine   Type 2 diabetes mellitus with hyperglycemia, without long-term current use of insulin ([place] - Primary   Relevant Orders   Comprehensive metabolic panel   Hemoglobin A1c   Hyperlipidemia associated with type 2 diabetes mellitus ([place]   Relevant Orders   Comprehensive metabolic panel   Lipid panel   Other Visit Diagnoses     Other chest pain       Relevant Orders   EKG 12-Lead   Generalized abdominal pain          DM type 2-chronic.  ? Control.  Pt not  checking.  Needs to resch DM teaching appt.  Cont metformin 5048mbid.  Diet/exercise.  Check A1C/cmp(no lab person today) HTN-chronic.  Controlled.  Cont losartan 10043mHLD-newer dx.  On atrovastatin 52m62mCheck lipids, cmp. Chest pain-?musculoskeletal, Cardiac, GI.  EKG ok.  Refer Card.   Abd pain.  Mostly lower.  Language barriers so hard to get details.  Will trial omeprazole 40mg15mly and f/u 1 mo, sooner if worsening  No orders of the defined types were placed in this encounter.   Mehlani Blankenburg MWellington Hampshire

## 2022-09-25 ENCOUNTER — Encounter: Payer: Self-pay | Admitting: Family Medicine

## 2022-09-25 ENCOUNTER — Encounter: Payer: Self-pay | Admitting: *Deleted

## 2022-09-25 ENCOUNTER — Ambulatory Visit (INDEPENDENT_AMBULATORY_CARE_PROVIDER_SITE_OTHER): Payer: Medicaid Other | Admitting: Family Medicine

## 2022-09-25 VITALS — BP 140/80 | HR 93 | Temp 97.5°F | Ht 60.0 in | Wt 150.5 lb

## 2022-09-25 DIAGNOSIS — E1165 Type 2 diabetes mellitus with hyperglycemia: Secondary | ICD-10-CM | POA: Diagnosis not present

## 2022-09-25 DIAGNOSIS — R1084 Generalized abdominal pain: Secondary | ICD-10-CM | POA: Diagnosis not present

## 2022-09-25 DIAGNOSIS — E785 Hyperlipidemia, unspecified: Secondary | ICD-10-CM

## 2022-09-25 DIAGNOSIS — E1169 Type 2 diabetes mellitus with other specified complication: Secondary | ICD-10-CM

## 2022-09-25 LAB — CBC WITH DIFFERENTIAL/PLATELET
Basophils Absolute: 0 10*3/uL (ref 0.0–0.1)
Basophils Relative: 0.4 % (ref 0.0–3.0)
Eosinophils Absolute: 0.1 10*3/uL (ref 0.0–0.7)
Eosinophils Relative: 1.4 % (ref 0.0–5.0)
HCT: 45.1 % (ref 36.0–46.0)
Hemoglobin: 15.1 g/dL — ABNORMAL HIGH (ref 12.0–15.0)
Lymphocytes Relative: 37.2 % (ref 12.0–46.0)
Lymphs Abs: 1.7 10*3/uL (ref 0.7–4.0)
MCHC: 33.4 g/dL (ref 30.0–36.0)
MCV: 95.2 fl (ref 78.0–100.0)
Monocytes Absolute: 0.4 10*3/uL (ref 0.1–1.0)
Monocytes Relative: 9.6 % (ref 3.0–12.0)
Neutro Abs: 2.3 10*3/uL (ref 1.4–7.7)
Neutrophils Relative %: 51.4 % (ref 43.0–77.0)
Platelets: 252 10*3/uL (ref 150.0–400.0)
RBC: 4.74 Mil/uL (ref 3.87–5.11)
RDW: 13.4 % (ref 11.5–15.5)
WBC: 4.5 10*3/uL (ref 4.0–10.5)

## 2022-09-25 LAB — COMPREHENSIVE METABOLIC PANEL
ALT: 89 U/L — ABNORMAL HIGH (ref 0–35)
AST: 73 U/L — ABNORMAL HIGH (ref 0–37)
Albumin: 4.1 g/dL (ref 3.5–5.2)
Alkaline Phosphatase: 118 U/L — ABNORMAL HIGH (ref 39–117)
BUN: 8 mg/dL (ref 6–23)
CO2: 28 mEq/L (ref 19–32)
Calcium: 9.5 mg/dL (ref 8.4–10.5)
Chloride: 106 mEq/L (ref 96–112)
Creatinine, Ser: 0.68 mg/dL (ref 0.40–1.20)
GFR: 96.59 mL/min (ref >60.00)
Glucose, Bld: 146 mg/dL — ABNORMAL HIGH (ref 70–99)
Potassium: 4 mEq/L (ref 3.5–5.1)
Sodium: 144 mEq/L (ref 135–145)
Total Bilirubin: 0.4 mg/dL (ref 0.2–1.2)
Total Protein: 6.7 g/dL (ref 6.0–8.3)

## 2022-09-25 LAB — LIPID PANEL
Cholesterol: 176 mg/dL (ref 0–200)
HDL: 47.4 mg/dL (ref >39.00)
LDL Cholesterol: 101 mg/dL — ABNORMAL HIGH (ref 0–99)
NonHDL: 128.59
Total CHOL/HDL Ratio: 4
Triglycerides: 136 mg/dL (ref 0.0–149.0)
VLDL: 27.2 mg/dL (ref 0.0–40.0)

## 2022-09-25 LAB — HEMOGLOBIN A1C: Hgb A1c MFr Bld: 7.4 % — ABNORMAL HIGH (ref 4.6–6.5)

## 2022-09-25 LAB — LIPASE: Lipase: <3 U/L — ABNORMAL LOW (ref 11.0–59.0)

## 2022-09-25 LAB — AMYLASE: Amylase: 26 U/L — ABNORMAL LOW (ref 27–131)

## 2022-09-25 NOTE — Patient Instructions (Signed)
Checking ultrasound of abdomen and see heart doctor

## 2022-09-25 NOTE — Progress Notes (Signed)
Subjective:     Patient ID: Kathy Lowe, female    DOB: 09-30-1965, 57 y.o.   MRN: [id number]  Chief Complaint  Patient presents with   Follow-up    4 week follow-up on abdominal pain No pain fasting    HPI-here w/interpreter. DM-here for labs. Abd pain-resolved. But stomach inflates after eats. No diarrhea/constipation.   Cp-when seated or lying down, pain.  Can be when active. No sob.does walk for exercise.    Health Maintenance Due  Topic Date Due   OPHTHALMOLOGY EXAM  Never done   HIV Screening  Never done   DTaP/Tdap/Td (1 - Tdap) Never done   COLONOSCOPY (Pts 45-49yrs Insurance coverage will need to be confirmed)  Never done   MAMMOGRAM  Never done   PAP SMEAR-Modifier  10/12/2017    Past Medical History:  Diagnosis Date   Diabetes mellitus without complication ([place])     History reviewed. No pertinent surgical history.  Outpatient Medications Prior to Visit  Medication Sig Dispense Refill   atorvastatin (LIPITOR) 20 MG tablet Take 1 tablet (20 mg total) by mouth daily. 90 tablet 0   glucose blood test strip Use as instructed 100 each 12   ketoconazole (NIZORAL) 2 % cream Apply 1 Application topically 2 (two) times daily. 60 g 1   losartan (COZAAR) 100 MG tablet Take 1 tablet (100 mg total) by mouth daily. 90 tablet 1   meloxicam (MOBIC) 7.5 MG tablet Take 1 tablet (7.5 mg total) by mouth daily. 15 tablet 0   metFORMIN (GLUCOPHAGE) 500 MG tablet TAKE 1 TABLET(500 MG) BY MOUTH TWICE DAILY WITH A MEAL 180 tablet 0   omeprazole (PRILOSEC) 40 MG capsule Take 1 capsule (40 mg total) by mouth daily. 90 capsule 0   OneTouch UltraSoft 2 Lancets MISC 1 each by Does not apply route 2 (two) times daily as needed. 100 each 1   No facility-administered medications prior to visit.    No Known Allergies ROS neg/noncontributory except as noted HPI/below      Objective:     BP (!) 140/80   Pulse 93   Temp (!) 97.5 F (36.4 C) (Temporal)   Ht 5' (1.524 m)   Wt  150 lb 8 oz (68.3 kg)   LMP 09/13/2014 Comment: has not had a period in 2 years, started on 09/13/14--   SpO2 98%   BMI 29.39 kg/m  Wt Readings from Last 3 Encounters:  09/25/22 150 lb 8 oz (68.3 kg)  08/28/22 153 lb 4 oz (69.5 kg)  06/14/22 152 lb (68.9 kg)    Physical Exam   Gen: WDWN NAD HEENT: NCAT, conjunctiva not injected, sclera nonicteric NECK:  supple, no thyromegaly, no nodes, no carotid bruits CARDIAC: RRR, S1S2+, no murmur. ABDOMEN:  BS+, soft, sl tender upper abd, No HSM, no masses EXT:  no edema MSK: no gross abnormalities.  NEURO: A&O x3.  CN II-XII intact.  PSYCH: normal mood. Good eye contact     Assessment & Plan:   Problem List Items Addressed This Visit       Endocrine   Type 2 diabetes mellitus with hyperglycemia, without long-term current use of insulin ([place]) - Primary   Hyperlipidemia associated with type 2 diabetes mellitus ([place])   Other Visit Diagnoses     Generalized abdominal pain       Relevant Orders   US Abdomen Complete   CBC with Differential/Platelet   Amylase   Lipase      Type  2 dm-chronic.  Check labs from last mo Abd pain-upper-some improvenment on omeprazole-cont.  Check u/s abd, cbcd,amylase,lipase Cp-see Card-reinforced to sch.  F/u 3 mo  No orders of the defined types were placed in this encounter.   Wellington Hampshire, MD

## 2022-09-26 ENCOUNTER — Other Ambulatory Visit: Payer: Self-pay | Admitting: *Deleted

## 2022-09-26 MED ORDER — METFORMIN HCL 1000 MG PO TABS: 1000.0000 mg | ORAL_TABLET | Freq: Two times a day (BID) | ORAL | 3 refills | Status: DC

## 2022-09-26 NOTE — Progress Notes (Signed)
1.  Sugars are still too high-increase metformin to 1000 mg twice daily (send in new prescription) 2.  Liver tests are little bit elevated-so her abdominal pain may be her gallbladder.  Await results of ultrasound.  If pain severe, needs to go to the emergency room.  Avoid greasy fatty foods

## 2022-10-30 ENCOUNTER — Ambulatory Visit
Admission: RE | Admit: 2022-10-30 | Discharge: 2022-10-30 | Disposition: A | Discharge status: Home / Self Care | Payer: Medicaid Other | Source: Ambulatory Visit | Attending: Family Medicine | Admitting: Family Medicine

## 2022-10-30 DIAGNOSIS — R1084 Generalized abdominal pain: Secondary | ICD-10-CM

## 2022-10-31 ENCOUNTER — Other Ambulatory Visit: Payer: Self-pay | Admitting: Family Medicine

## 2022-10-31 ENCOUNTER — Other Ambulatory Visit: Payer: Self-pay | Admitting: *Deleted

## 2022-10-31 DIAGNOSIS — R7989 Other specified abnormal findings of blood chemistry: Secondary | ICD-10-CM

## 2022-10-31 DIAGNOSIS — E1165 Type 2 diabetes mellitus with hyperglycemia: Secondary | ICD-10-CM

## 2022-10-31 DIAGNOSIS — K8020 Calculus of gallbladder without cholecystitis without obstruction: Secondary | ICD-10-CM

## 2022-11-01 ENCOUNTER — Telehealth: Payer: Self-pay | Admitting: Family Medicine

## 2022-11-01 NOTE — Telephone Encounter (Signed)
Returned call, went over results and recommendations.

## 2022-11-01 NOTE — Telephone Encounter (Signed)
Patient's daughter states she was returning Kathy Lowe's call about patients results. Requests a callback when able.

## 2022-12-02 ENCOUNTER — Other Ambulatory Visit: Payer: Self-pay | Admitting: Family Medicine

## 2022-12-08 ENCOUNTER — Inpatient Hospital Stay (HOSPITAL_COMMUNITY)
Admission: EM | Admit: 2022-12-08 | Discharge: 2022-12-11 | DRG: 419 | Disposition: A | Discharge status: Home / Self Care | Payer: Medicaid Other | Attending: Surgery | Admitting: Surgery

## 2022-12-08 ENCOUNTER — Emergency Department (HOSPITAL_COMMUNITY): Payer: Medicaid Other

## 2022-12-08 ENCOUNTER — Encounter (HOSPITAL_COMMUNITY): Payer: Self-pay

## 2022-12-08 ENCOUNTER — Other Ambulatory Visit: Payer: Self-pay

## 2022-12-08 DIAGNOSIS — K8020 Calculus of gallbladder without cholecystitis without obstruction: Principal | ICD-10-CM

## 2022-12-08 DIAGNOSIS — E785 Hyperlipidemia, unspecified: Secondary | ICD-10-CM | POA: Diagnosis present

## 2022-12-08 DIAGNOSIS — Z7984 Long term (current) use of oral hypoglycemic drugs: Secondary | ICD-10-CM

## 2022-12-08 DIAGNOSIS — Z808 Family history of malignant neoplasm of other organs or systems: Secondary | ICD-10-CM

## 2022-12-08 DIAGNOSIS — E1169 Type 2 diabetes mellitus with other specified complication: Secondary | ICD-10-CM | POA: Diagnosis present

## 2022-12-08 DIAGNOSIS — K8012 Calculus of gallbladder with acute and chronic cholecystitis without obstruction: Principal | ICD-10-CM | POA: Diagnosis present

## 2022-12-08 DIAGNOSIS — K819 Cholecystitis, unspecified: Secondary | ICD-10-CM | POA: Diagnosis present

## 2022-12-08 DIAGNOSIS — Z791 Long term (current) use of non-steroidal anti-inflammatories (NSAID): Secondary | ICD-10-CM

## 2022-12-08 DIAGNOSIS — I10 Essential (primary) hypertension: Secondary | ICD-10-CM | POA: Diagnosis present

## 2022-12-08 DIAGNOSIS — K810 Acute cholecystitis: Secondary | ICD-10-CM | POA: Diagnosis present

## 2022-12-08 DIAGNOSIS — Z79899 Other long term (current) drug therapy: Secondary | ICD-10-CM

## 2022-12-08 DIAGNOSIS — Z833 Family history of diabetes mellitus: Secondary | ICD-10-CM

## 2022-12-08 DIAGNOSIS — F17210 Nicotine dependence, cigarettes, uncomplicated: Secondary | ICD-10-CM | POA: Diagnosis present

## 2022-12-08 HISTORY — DX: Anemia, unspecified: D64.9

## 2022-12-08 LAB — COMPREHENSIVE METABOLIC PANEL
ALT: 55 U/L — ABNORMAL HIGH (ref 0–44)
AST: 95 U/L — ABNORMAL HIGH (ref 15–41)
Albumin: 4 g/dL (ref 3.5–5.0)
Alkaline Phosphatase: 135 U/L — ABNORMAL HIGH (ref 38–126)
Anion gap: 9 (ref 5–15)
BUN: 9 mg/dL (ref 6–20)
CO2: 24 mmol/L (ref 22–32)
Calcium: 8.9 mg/dL (ref 8.9–10.3)
Chloride: 107 mmol/L (ref 98–111)
Creatinine, Ser: 0.6 mg/dL (ref 0.44–1.00)
GFR, Estimated: >60 mL/min (ref >60)
Glucose, Bld: 187 mg/dL — ABNORMAL HIGH (ref 70–99)
Potassium: 3.5 mmol/L (ref 3.5–5.1)
Sodium: 140 mmol/L (ref 135–145)
Total Bilirubin: 0.6 mg/dL (ref 0.3–1.2)
Total Protein: 7.2 g/dL (ref 6.5–8.1)

## 2022-12-08 LAB — URINALYSIS, ROUTINE W REFLEX MICROSCOPIC
Bilirubin Urine: NEGATIVE
Glucose, UA: NEGATIVE mg/dL
Ketones, ur: NEGATIVE mg/dL
Leukocytes,Ua: NEGATIVE
Nitrite: NEGATIVE
Protein, ur: NEGATIVE mg/dL
Specific Gravity, Urine: 1.017 (ref 1.005–1.030)
pH: 5 (ref 5.0–8.0)

## 2022-12-08 LAB — CBC
HCT: 42.1 % (ref 36.0–46.0)
Hemoglobin: 14.1 g/dL (ref 12.0–15.0)
MCH: 31.3 pg (ref 26.0–34.0)
MCHC: 33.5 g/dL (ref 30.0–36.0)
MCV: 93.6 fL (ref 80.0–100.0)
Platelets: 262 10*3/uL (ref 150–400)
RBC: 4.5 MIL/uL (ref 3.87–5.11)
RDW: 12.8 % (ref 11.5–15.5)
WBC: 10.2 10*3/uL (ref 4.0–10.5)
nRBC: 0 % (ref 0.0–0.2)

## 2022-12-08 LAB — GLUCOSE, CAPILLARY
Glucose-Capillary: 133 mg/dL — ABNORMAL HIGH (ref 70–99)
Glucose-Capillary: 134 mg/dL — ABNORMAL HIGH (ref 70–99)

## 2022-12-08 LAB — LIPASE, BLOOD: Lipase: 28 U/L (ref 11–51)

## 2022-12-08 MED ORDER — ACETAMINOPHEN 500 MG PO TABS
1000.0000 mg | ORAL_TABLET | Freq: Four times a day (QID) | ORAL | Status: DC
  Administered 2022-12-08 – 2022-12-11 (×11): 1000 mg via ORAL
  Filled 2022-12-08 (×11): qty 2

## 2022-12-08 MED ORDER — IOHEXOL 350 MG/ML SOLN
75.0000 mL | Freq: Once | INTRAVENOUS | Status: AC | PRN
  Administered 2022-12-08: 75 mL via INTRAVENOUS

## 2022-12-08 MED ORDER — OXYCODONE HCL 5 MG PO TABS
5.0000 mg | ORAL_TABLET | Freq: Four times a day (QID) | ORAL | Status: DC | PRN
  Administered 2022-12-09 – 2022-12-10 (×3): 5 mg via ORAL
  Filled 2022-12-08 (×3): qty 1

## 2022-12-08 MED ORDER — ALUM & MAG HYDROXIDE-SIMETH 200-200-20 MG/5ML PO SUSP
30.0000 mL | Freq: Once | ORAL | Status: AC
  Administered 2022-12-08: 30 mL via ORAL
  Filled 2022-12-08: qty 30

## 2022-12-08 MED ORDER — INSULIN ASPART 100 UNIT/ML IJ SOLN
0.0000 [IU] | INTRAMUSCULAR | Status: DC
  Administered 2022-12-08 – 2022-12-09 (×4): 2 [IU] via SUBCUTANEOUS
  Administered 2022-12-09: 3 [IU] via SUBCUTANEOUS
  Administered 2022-12-09: 5 [IU] via SUBCUTANEOUS
  Administered 2022-12-09: 2 [IU] via SUBCUTANEOUS
  Administered 2022-12-10: 3 [IU] via SUBCUTANEOUS
  Administered 2022-12-10: 5 [IU] via SUBCUTANEOUS
  Administered 2022-12-10: 2 [IU] via SUBCUTANEOUS
  Administered 2022-12-10 – 2022-12-11 (×3): 3 [IU] via SUBCUTANEOUS
  Administered 2022-12-11: 2 [IU] via SUBCUTANEOUS

## 2022-12-08 MED ORDER — MELATONIN 3 MG PO TABS: 3.0000 mg | ORAL_TABLET | Freq: Every evening | ORAL | Status: DC | PRN

## 2022-12-08 MED ORDER — METOPROLOL TARTRATE 5 MG/5ML IV SOLN: 5.0000 mg | Freq: Four times a day (QID) | INTRAVENOUS | Status: DC | PRN

## 2022-12-08 MED ORDER — ENOXAPARIN SODIUM 40 MG/0.4ML IJ SOSY
40.0000 mg | PREFILLED_SYRINGE | INTRAMUSCULAR | Status: DC
  Administered 2022-12-09 – 2022-12-11 (×3): 40 mg via SUBCUTANEOUS
  Filled 2022-12-08 (×3): qty 0.4

## 2022-12-08 MED ORDER — HYDROMORPHONE HCL 1 MG/ML IJ SOLN: 0.5000 mg | INTRAMUSCULAR | Status: DC | PRN

## 2022-12-08 MED ORDER — METHOCARBAMOL 1000 MG/10ML IJ SOLN: 500.0000 mg | Freq: Three times a day (TID) | INTRAVENOUS | Status: DC | PRN

## 2022-12-08 MED ORDER — DIPHENHYDRAMINE HCL 25 MG PO CAPS: 25.0000 mg | ORAL_CAPSULE | Freq: Four times a day (QID) | ORAL | Status: DC | PRN

## 2022-12-08 MED ORDER — KETOROLAC TROMETHAMINE 15 MG/ML IJ SOLN
15.0000 mg | Freq: Once | INTRAMUSCULAR | Status: AC
  Administered 2022-12-08: 15 mg via INTRAVENOUS
  Filled 2022-12-08: qty 1

## 2022-12-08 MED ORDER — IBUPROFEN 600 MG PO TABS: 600.0000 mg | ORAL_TABLET | Freq: Four times a day (QID) | ORAL | Status: DC | PRN

## 2022-12-08 MED ORDER — SODIUM CHLORIDE 0.9 % IV SOLN: INTRAVENOUS | Status: DC

## 2022-12-08 MED ORDER — BISACODYL 10 MG RE SUPP: 10.0000 mg | Freq: Every day | RECTAL | Status: DC | PRN

## 2022-12-08 MED ORDER — DOCUSATE SODIUM 100 MG PO CAPS
100.0000 mg | ORAL_CAPSULE | Freq: Two times a day (BID) | ORAL | Status: DC
  Administered 2022-12-08 – 2022-12-11 (×6): 100 mg via ORAL
  Filled 2022-12-08 (×6): qty 1

## 2022-12-08 MED ORDER — ONDANSETRON HCL 4 MG/2ML IJ SOLN: 4.0000 mg | Freq: Four times a day (QID) | INTRAMUSCULAR | Status: DC | PRN

## 2022-12-08 MED ORDER — TRAMADOL HCL 50 MG PO TABS: 50.0000 mg | ORAL_TABLET | Freq: Four times a day (QID) | ORAL | Status: DC | PRN

## 2022-12-08 MED ORDER — PANTOPRAZOLE SODIUM 40 MG PO TBEC
40.0000 mg | DELAYED_RELEASE_TABLET | Freq: Every day | ORAL | Status: DC
  Administered 2022-12-08 – 2022-12-11 (×4): 40 mg via ORAL
  Filled 2022-12-08 (×4): qty 1

## 2022-12-08 MED ORDER — ONDANSETRON 4 MG PO TBDP: 4.0000 mg | ORAL_TABLET | Freq: Four times a day (QID) | ORAL | Status: DC | PRN

## 2022-12-08 MED ORDER — SODIUM CHLORIDE 0.9 % IV SOLN
2.0000 g | INTRAVENOUS | Status: DC
  Administered 2022-12-08 – 2022-12-10 (×3): 2 g via INTRAVENOUS
  Filled 2022-12-08 (×3): qty 20

## 2022-12-08 MED ORDER — ONDANSETRON HCL 4 MG/2ML IJ SOLN
4.0000 mg | Freq: Once | INTRAMUSCULAR | Status: AC
  Administered 2022-12-08: 4 mg via INTRAVENOUS
  Filled 2022-12-08: qty 2

## 2022-12-08 MED ORDER — DIPHENHYDRAMINE HCL 50 MG/ML IJ SOLN: 25.0000 mg | Freq: Four times a day (QID) | INTRAMUSCULAR | Status: DC | PRN

## 2022-12-08 MED ORDER — SODIUM CHLORIDE 0.9 % IV BOLUS
1000.0000 mL | Freq: Once | INTRAVENOUS | Status: AC
  Administered 2022-12-08: 1000 mL via INTRAVENOUS

## 2022-12-08 MED ORDER — METHOCARBAMOL 500 MG PO TABS: 500.0000 mg | ORAL_TABLET | Freq: Three times a day (TID) | ORAL | Status: DC | PRN

## 2022-12-08 MED ORDER — HYDRALAZINE HCL 20 MG/ML IJ SOLN: 10.0000 mg | INTRAMUSCULAR | Status: DC | PRN

## 2022-12-08 NOTE — ED Provider Notes (Signed)
[facility] Provider Note  CSN: [id number] Arrival date & time: 12/08/22 1026  Chief Complaint(s) Abdominal Pain  HPI Kathy Lowe is a 57 y.o. female with past medical history as below, significant for DM2, HLD, HTN, tobacco use and daily alcohol use who presents to the ED with complaint of epigastric and right upper quadrant pain, nausea.  Pain has been intermittent over the past 2 years, has worsened in the past few days.  Experience pain this morning that was worsened from prior, epigastric, right upper quadrant described as sharp and stabbing.  Occasional burning.  Nausea with vomiting or mainly, no vomiting the past 24 hours.  Reports daily alcohol use, daily tobacco use.  Denies history of alcohol withdrawal in the past.  Does not use daily NSAIDs.  No melena, no change to urine function.  No recent dental trauma.  Denies abdominal surgery history.  Feels symptoms occur at random, sometimes postprandial but unclear.  Interpreter services offered however patient first to have son at bedside translate for her  Past Medical History Past Medical History:  Diagnosis Date   Anemia    Diabetes mellitus without complication [facility])    Patient Active Problem List   Diagnosis Date Noted   Cholecystitis 12/08/2022   Hyperlipidemia associated with type 2 diabetes mellitus (HCC) 05/28/2022   Type 2 diabetes mellitus with hyperglycemia, without long-term current use of insulin (HCC) 03/30/2022   Primary hypertension 03/30/2022   Endometrial mass 12/26/2015   IUD complication (HCC) 12/26/2015   Smoker 11/10/2014   Home Medication(s) Prior to Admission medications   Medication Sig Start Date End Date Taking? Authorizing Provider  atorvastatin (LIPITOR) 20 MG tablet Take 20 mg by mouth daily.   Yes [provider]  ibuprofen (ADVIL) 200 MG tablet Take 400 mg by mouth daily. Patient took 2 tablets prior to coming to the Hospital today, 12/08/22.   Yes [provider]  metFORMIN (GLUCOPHAGE) 1000 MG tablet Take 1 tablet (1,000 mg total) by mouth 2 (two) times daily with a meal. 09/26/22  Yes Jeani Sow, MD  omeprazole (PRILOSEC) 40 MG capsule TAKE 1 CAPSULE(40 MG) BY MOUTH DAILY Patient taking differently: Take 40 mg by mouth daily. 12/04/22  Yes Jeani Sow, MD  glucose blood test strip Use as instructed 06/14/22   Jeani Sow, MD  OneTouch UltraSoft 2 Lancets MISC 1 each by Does not apply route 2 (two) times daily as needed. 06/14/22   Jeani Sow, MD                                                                                                                                    Past Surgical History Past Surgical History:  Procedure Laterality Date   NO PAST SURGERIES     Family History Family History  Problem Relation Age of Onset   Cancer Sister        "  bones".  another sister-uterin   Diabetes Sister     Social History Social History   Tobacco Use   Smoking status: Every Day    Packs/day: .25    Types: Cigarettes   Smokeless tobacco: Current   Tobacco comments:    Last time 8 days ago  Vaping Use   Vaping Use: Never used  Substance Use Topics   Alcohol use: Yes    Alcohol/week: 7.0 standard drinks of alcohol    Types: 7 Cans of beer per week    Comment: weekend   Drug use: No   Allergies Patient has no known allergies.  Review of Systems Review of Systems  Constitutional:  Negative for activity change and fever.  HENT:  Negative for facial swelling and trouble swallowing.   Eyes:  Negative for discharge and redness.  Respiratory:  Negative for cough and shortness of breath.   Cardiovascular:  Negative for chest pain and palpitations.  Gastrointestinal:  Positive for abdominal pain, nausea and vomiting.  Genitourinary:  Negative for dysuria and flank pain.  Musculoskeletal:  Negative for back pain and gait problem.  Skin:  Negative for pallor and rash.  Neurological:  Negative for syncope  and headaches.    Physical Exam Vital Signs  I have reviewed the triage vital signs BP 125/63 (BP Location: Left Arm)   Pulse 94   Temp 97.8 F (36.6 C)   Resp 18   Ht 5\' 4"  (1.626 m)   Wt 68.3 kg   LMP 09/13/2014 Comment: has not had a period in 2 years, started on 09/13/14--   SpO2 93%   BMI 25.85 kg/m  Physical Exam Vitals and nursing note reviewed.  Constitutional:      General: She is not in acute distress.    Appearance: Normal appearance. She is well-developed. She is obese.  HENT:     Head: Normocephalic and atraumatic.     Right Ear: External ear normal.     Left Ear: External ear normal.     Nose: Nose normal.     Mouth/Throat:     Mouth: Mucous membranes are moist.  Eyes:     General: No scleral icterus.       Right eye: No discharge.        Left eye: No discharge.  Cardiovascular:     Rate and Rhythm: Regular rhythm. Tachycardia present.     Pulses: Normal pulses.     Heart sounds: Normal heart sounds.  Pulmonary:     Effort: Pulmonary effort is normal. No respiratory distress.     Breath sounds: Normal breath sounds.  Abdominal:     General: Abdomen is flat.     Palpations: Abdomen is soft.     Tenderness: There is abdominal tenderness in the right upper quadrant and epigastric area. There is no guarding or rebound.  Musculoskeletal:        General: Normal range of motion.     Right lower leg: No edema.     Left lower leg: No edema.  Skin:    General: Skin is warm and dry.     Capillary Refill: Capillary refill takes less than 2 seconds.  Neurological:     Mental Status: She is alert and oriented to person, place, and time.     GCS: GCS eye subscore is 4. GCS verbal subscore is 5. GCS motor subscore is 6.  Psychiatric:        Mood and Affect: Mood normal.  Behavior: Behavior normal.     ED Results and Treatments Labs (all labs ordered are listed, but only abnormal results are displayed) Labs Reviewed  COMPREHENSIVE METABOLIC PANEL -  Abnormal; Notable for the following components:      Result Value   Glucose, Bld 187 (*)    AST 95 (*)    ALT 55 (*)    Alkaline Phosphatase 135 (*)    All other components within normal limits  URINALYSIS, ROUTINE W REFLEX MICROSCOPIC - Abnormal; Notable for the following components:   APPearance HAZY (*)    Hgb urine dipstick SMALL (*)    Bacteria, UA RARE (*)    All other components within normal limits  COMPREHENSIVE METABOLIC PANEL - Abnormal; Notable for the following components:   Potassium 3.1 (*)    Glucose, Bld 141 (*)    BUN <5 (*)    Calcium 8.4 (*)    Albumin 3.4 (*)    AST 111 (*)    ALT 82 (*)    Alkaline Phosphatase 149 (*)    All other components within normal limits  GLUCOSE, CAPILLARY - Abnormal; Notable for the following components:   Glucose-Capillary 133 (*)    All other components within normal limits  GLUCOSE, CAPILLARY - Abnormal; Notable for the following components:   Glucose-Capillary 134 (*)    All other components within normal limits  GLUCOSE, CAPILLARY - Abnormal; Notable for the following components:   Glucose-Capillary 123 (*)    All other components within normal limits  GLUCOSE, CAPILLARY - Abnormal; Notable for the following components:   Glucose-Capillary 140 (*)    All other components within normal limits  GLUCOSE, CAPILLARY - Abnormal; Notable for the following components:   Glucose-Capillary 138 (*)    All other components within normal limits  LIPASE, BLOOD  CBC  MAGNESIUM  CBC  HEMOGLOBIN A1C  MISC [facility] TEST (SEND OUT)                                                                                                                          Radiology US Abdomen Limited RUQ (LIVER/GB)  Result Date: 12/08/2022 CLINICAL DATA:  Right upper quadrant pain EXAM: ULTRASOUND ABDOMEN LIMITED RIGHT UPPER QUADRANT COMPARISON:  CT scan of the abdomen pelvis December 08, 2022 FINDINGS: Gallbladder: A small amount of pericholecystic fluid is  again identified. No gallbladder wall thickening. A rounded hyperechoic mildly shadowing region is identified in the gallbladder measuring 6 mm. This structure is non mobile throughout the study. No other evidence of stones. A positive Murphy's sign is reported. Common bile duct: Diameter: 3.5 mm Liver: Diffuse increased echogenicity. No focal mass. Portal vein is patent on color Doppler imaging with normal direction of blood flow towards the liver. Other: None. IMPRESSION: 1. A small amount of pericholecystic fluid is again identified. A positive Murphy's sign is reported. No gallbladder wall thickening. A 6 mm rounded hyperechoic mildly shadowing structure is identified in the gallbladder. This structure  is non mobile throughout the study. This could represent a small stone adherent to the gallbladder wall or a polyp. If the clinical picture remains ambiguous and acute cholecystitis is a concern, recommend a HIDA scan. If the patient's gallbladder is not removed, recommend a 12 month follow-up ultrasound given the possibility of a 6 mm polyp. 2. Hepatic steatosis. Electronically Signed   By: Gerome Sam III M.D.   On: 12/08/2022 14:36   CT ABDOMEN PELVIS W CONTRAST  Result Date: 12/08/2022 CLINICAL DATA:  Epigastric pain EXAM: CT ABDOMEN AND PELVIS WITH CONTRAST TECHNIQUE: Multidetector CT imaging of the abdomen and pelvis was performed using the standard protocol following bolus administration of intravenous contrast. RADIATION DOSE REDUCTION: This exam was performed according to the departmental dose-optimization program which includes automated exposure control, adjustment of the mA and/or kV according to patient size and/or use of iterative reconstruction technique. CONTRAST:  75mL OMNIPAQUE IOHEXOL 350 MG/ML SOLN COMPARISON:  Ultrasound 10/30/2022 FINDINGS: Lower chest: Left basilar scarring or atelectasis. Small hiatal hernia. Heart size is normal. Hepatobiliary: Diffusely decreased attenuation of the  hepatic parenchyma. No focal liver lesion is identified. Small stone or polyp within the gallbladder fundus (series 6, image 21). Trace pericholecystic edema. No fat stranding or appreciable wall thickening. No biliary dilatation. Pancreas: Unremarkable. No pancreatic ductal dilatation or surrounding inflammatory changes. Spleen: Normal in size without focal abnormality. Adrenals/Urinary Tract: Unremarkable adrenal glands. 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 Kidneys enhance symmetrically without focal lesion, stone, or hydronephrosis. Ureters are nondilated. Urinary bladder appears unremarkable for the degree of distention. Stomach/Bowel: Small hiatal hernia. Stomach is otherwise within normal limits. Appendix appears normal (series 3, image 65). No evidence of bowel wall thickening, distention, or inflammatory changes. Vascular/Lymphatic: No significant vascular findings are present. There are a few mildly prominent mesenteric lymph nodes on the right, largest measuring 9 mm short axis (series 3, image 45). No pathologically enlarged abdominal or pelvic lymph nodes by size criteria. Reproductive: Uterus and bilateral adnexa are unremarkable. Other: No free fluid. No abdominopelvic fluid collection. No pneumoperitoneum. No abdominal wall hernia. Musculoskeletal: No acute or significant osseous findings. IMPRESSION: 1. Small stone or polyp within the gallbladder fundus with trace pericholecystic edema. No fat stranding or appreciable wall thickening. If there is concern for acute cholecystitis, right upper quadrant ultrasound is recommended. 2. Hepatic steatosis. 3. Small hiatal hernia. 4. Mildly prominent mesenteric lymph nodes on the right, which may be reactive. Electronically Signed   By: Duanne Guess D.O.   On: 12/08/2022 13:18    Pertinent labs & imaging results that were available during my care of the patient were reviewed by me and considered in my medical decision making (see  MDM for details).  Medications Ordered in ED Medications  enoxaparin (LOVENOX) injection 40 mg ( Subcutaneous Automatically Held 12/17/22 1000)  0.9 %  sodium chloride infusion ( Intravenous New Bag/Given 12/09/22 0312)  cefTRIAXone (ROCEPHIN) 2 g in sodium chloride 0.9 % 100 mL IVPB ( Intravenous Automatically Held 12/14/22 1645)  acetaminophen (TYLENOL) tablet 1,000 mg ( Oral Automatically Held 12/24/22 1800)  traMADol (ULTRAM) tablet 50 mg ( Oral MAR Hold 12/09/22 1030)  ibuprofen (ADVIL) tablet 600 mg ( Oral MAR Hold 12/09/22 1030)  oxyCODONE (Oxy IR/ROXICODONE) immediate release tablet 5 mg ( Oral MAR Hold 12/09/22 1030)  HYDROmorphone (DILAUDID) injection 0.5 mg ( Intravenous MAR Hold 12/09/22 1030)  methocarbamol (ROBAXIN) tablet 500 mg ( Oral MAR Hold 12/09/22 1030)    Or  methocarbamol (ROBAXIN) 500 mg  in dextrose 5 % 50 mL IVPB ( Intravenous MAR Hold 12/09/22 1030)  melatonin tablet 3 mg ( Oral MAR Hold 12/09/22 1030)  diphenhydrAMINE (BENADRYL) capsule 25 mg ( Oral MAR Hold 12/09/22 1030)    Or  diphenhydrAMINE (BENADRYL) injection 25 mg ( Intravenous MAR Hold 12/09/22 1030)  docusate sodium (COLACE) capsule 100 mg ( Oral Automatically Held 12/24/22 2200)  bisacodyl (DULCOLAX) suppository 10 mg ( Rectal MAR Hold 12/09/22 1030)  ondansetron (ZOFRAN-ODT) disintegrating tablet 4 mg ( Oral MAR Hold 12/09/22 1030)    Or  ondansetron (ZOFRAN) injection 4 mg ( Intravenous MAR Hold 12/09/22 1030)  metoprolol tartrate (LOPRESSOR) injection 5 mg ( Intravenous MAR Hold 12/09/22 1030)  hydrALAZINE (APRESOLINE) injection 10 mg ( Intravenous MAR Hold 12/09/22 1030)  pantoprazole (PROTONIX) EC tablet 40 mg ( Oral Automatically Held 12/24/22 1000)  insulin aspart (novoLOG) injection 0-15 Units ( Subcutaneous Automatically Held 12/24/22 2000)  chlorhexidine (PERIDEX) 0.12 % solution (has no administration in time range)  chlorhexidine (PERIDEX) 0.12 % solution (has no administration in time range)  alum & mag hydroxide-simeth  (MAALOX/MYLANTA) 200-200-20 MG/5ML suspension 30 mL (30 mLs Oral Given 12/08/22 1240)  ondansetron (ZOFRAN) injection 4 mg (4 mg Intravenous Given 12/08/22 1239)  sodium chloride 0.9 % bolus 1,000 mL (0 mLs Intravenous Stopped 12/08/22 1400)  iohexol (OMNIPAQUE) 350 MG/ML injection 75 mL (75 mLs Intravenous Contrast Given 12/08/22 1309)  ketorolac (TORADOL) 15 MG/ML injection 15 mg (15 mg Intravenous Given 12/08/22 1605)                                                                                                                                     Procedures Procedures  (including critical care time)  Medical Decision Making / ED Course    Medical Decision Making:    RAETTA HUESTIS is a 57 y.o. female DM2, HLD, daily tobacco and alcohol use here with epigastric, right upper quadrant Donnell pain. The complaint involves an extensive differential diagnosis and also carries with it a high risk of complications and morbidity.  Serious etiology was considered. Ddx includes but is not limited to: Differential diagnosis includes but is not exclusive to acute cholecystitis, intrathoracic causes for epigastric abdominal pain, gastritis, duodenitis, pancreatitis, small bowel or large bowel obstruction, abdominal aortic aneurysm, hernia, gastritis, etc.   Complete initial physical exam performed, notably the patient  was no acute distress, breathing comfortably ambient air, she was tachycardic.    Reviewed and confirmed nursing documentation for past medical history, family history, social history.  Vital signs reviewed.    Clinical Course as of 12/09/22 1114  Sat Dec 08, 2022  1209 Squamous Epithelial / HPF: 6-10 Contamination noted  [SG]  1209 AST(!): 95 [SG]  1209 ALT(!): 55 [SG]  1209 Alkaline Phosphatase(!): 135 RUQ and epig abd pain, hx gallstones, will get CTAP [SG]  1209 Total Bilirubin: 0.6 [SG]  1337 CTAP with trace pericholecystic fluid, ?  gallstone; will get RUQ [place] [SG]  1552 Ultrasound  with gallbladder, pericholecystic fluid.  Positive Murphy sign.  Concern for cholecystitis versus symptomatic cholelithiasis.  Will discuss with general surgery [SG]  1555 Spoke with Dr Fredricka Bonine gen surg, will eval [SG]    Clinical Course User Index [SG] Sloan Leiter, DO   Interpreter services offered however patient first to have son at bedside translate for her  Patient admitted general surgery for surgical intervention, her pain is improved on recheck.  Abdomen is nonperitoneal, HDS.  Recommend smoking cessation     Additional history obtained: -Additional history obtained from family -External records from outside source obtained and reviewed including: Chart review including previous notes, labs, imaging, consultation notes including  Prior primary care documentation, prior ED visits, prior labs and imaging, medications She was seen by [facility] on 4/24 with diagnosis of gallstones and elevated LFTs [place] of abdomen 10/30/22 trace cholelithiasis without cholecystitis, hepatic steatosis    Lab Tests: -I ordered, reviewed, and interpreted labs.   The pertinent results include:   Labs Reviewed  COMPREHENSIVE METABOLIC PANEL - Abnormal; Notable for the following components:      Result Value   Glucose, Bld 187 (*)    AST 95 (*)    ALT 55 (*)    Alkaline Phosphatase 135 (*)    All other components within normal limits  URINALYSIS, ROUTINE W REFLEX MICROSCOPIC - Abnormal; Notable for the following components:   APPearance HAZY (*)    Hgb urine dipstick SMALL (*)    Bacteria, UA RARE (*)    All other components within normal limits  COMPREHENSIVE METABOLIC PANEL - Abnormal; Notable for the following components:   Potassium 3.1 (*)    Glucose, Bld 141 (*)    BUN <5 (*)    Calcium 8.4 (*)    Albumin 3.4 (*)    AST 111 (*)    ALT 82 (*)    Alkaline Phosphatase 149 (*)    All other components within normal limits  GLUCOSE, CAPILLARY - Abnormal; Notable for the following  components:   Glucose-Capillary 133 (*)    All other components within normal limits  GLUCOSE, CAPILLARY - Abnormal; Notable for the following components:   Glucose-Capillary 134 (*)    All other components within normal limits  GLUCOSE, CAPILLARY - Abnormal; Notable for the following components:   Glucose-Capillary 123 (*)    All other components within normal limits  GLUCOSE, CAPILLARY - Abnormal; Notable for the following components:   Glucose-Capillary 140 (*)    All other components within normal limits  GLUCOSE, CAPILLARY - Abnormal; Notable for the following components:   Glucose-Capillary 138 (*)    All other components within normal limits  LIPASE, BLOOD  CBC  MAGNESIUM  CBC  HEMOGLOBIN A1C  MISC [facility] TEST (SEND OUT)    Notable for as above, stable  EKG   EKG Interpretation  Date/Time:  Saturday December 08 2022 12:06:01 EDT Ventricular Rate:  101 PR Interval:  128 QRS Duration: 95 QT Interval:  359 QTC Calculation: 466 R Axis:   31 Text Interpretation: Sinus tachycardia Abnormal R-wave progression, early transition similar to prio 12/21, no stemi Confirmed by Tanda Rockers ([id number]) on 12/08/2022 12:23:44 PM         Imaging Studies ordered: I ordered imaging studies including CT abdomen ultrasound abdomen I independently visualized the following imaging with scope of interpretation limited to determining acute life threatening conditions related to emergency care; findings noted above,  significant for cholelithiasis, pericholecystic fluid I independently visualized and interpreted imaging. I agree with the radiologist interpretation   Medicines ordered and prescription drug management: Meds ordered this encounter  Medications   alum & mag hydroxide-simeth (MAALOX/MYLANTA) 200-200-20 MG/5ML suspension 30 mL   ondansetron (ZOFRAN) injection 4 mg   sodium chloride 0.9 % bolus 1,000 mL   iohexol (OMNIPAQUE) 350 MG/ML injection 75 mL   ketorolac (TORADOL) 15 MG/ML  injection 15 mg   enoxaparin (LOVENOX) injection 40 mg   0.9 %  sodium chloride infusion   cefTRIAXone (ROCEPHIN) 2 g in sodium chloride 0.9 % 100 mL IVPB    Order Specific Question:   Antibiotic Indication:    Answer:   Intra-abdominal   acetaminophen (TYLENOL) tablet 1,000 mg   traMADol (ULTRAM) tablet 50 mg   ibuprofen (ADVIL) tablet 600 mg   oxyCODONE (Oxy IR/ROXICODONE) immediate release tablet 5 mg   HYDROmorphone (DILAUDID) injection 0.5 mg   OR Linked Order Group    methocarbamol (ROBAXIN) tablet 500 mg    methocarbamol (ROBAXIN) 500 mg in dextrose 5 % 50 mL IVPB   melatonin tablet 3 mg   OR Linked Order Group    diphenhydrAMINE (BENADRYL) capsule 25 mg    diphenhydrAMINE (BENADRYL) injection 25 mg   docusate sodium (COLACE) capsule 100 mg   bisacodyl (DULCOLAX) suppository 10 mg   OR Linked Order Group    ondansetron (ZOFRAN-ODT) disintegrating tablet 4 mg    ondansetron (ZOFRAN) injection 4 mg   metoprolol tartrate (LOPRESSOR) injection 5 mg   hydrALAZINE (APRESOLINE) injection 10 mg   pantoprazole (PROTONIX) EC tablet 40 mg   insulin aspart (novoLOG) injection 0-15 Units    Order Specific Question:   Correction coverage:    Answer:   Moderate (average weight, post-op)    Order Specific Question:   CBG < 70:    Answer:   Implement Hypoglycemia Standing Orders and refer to Hypoglycemia Standing Orders sidebar report    Order Specific Question:   CBG 70 - 120:    Answer:   0 units    Order Specific Question:   CBG 121 - 150:    Answer:   2 units    Order Specific Question:   CBG 151 - 200:    Answer:   3 units    Order Specific Question:   CBG 201 - 250:    Answer:   5 units    Order Specific Question:   CBG 251 - 300:    Answer:   8 units    Order Specific Question:   CBG 301 - 350:    Answer:   11 units    Order Specific Question:   CBG 351 - 400:    Answer:   15 units    Order Specific Question:   CBG > 400    Answer:   call MD and obtain STAT lab verification    chlorhexidine (PERIDEX) 0.12 % solution    Kathrene Bongo D: cabinet override   chlorhexidine (PERIDEX) 0.12 % solution    Aquilla Hacker M: cabinet override    -I have reviewed the patients home medicines and have made adjustments as needed   Consultations Obtained: I requested consultation with the general surgery,  and discussed lab and imaging findings as well as pertinent plan - they recommend: Admission   Cardiac Monitoring: The patient was maintained on a cardiac monitor.  I personally viewed and interpreted the cardiac monitored which showed an underlying rhythm  of: NSR  Social Determinants of Health:  Diagnosis or treatment significantly limited by social determinants of health: current smoker Spanish-speaking Counseled patient for approximately 3 minutes regarding smoking cessation. Discussed risks of smoking and how they applied and affected their visit here today. Patient not ready to quit at this time, however will follow up with their primary doctor when they are.   CPT code: 16109: intermediate counseling for smoking cessation    Reevaluation: After the interventions noted above, I reevaluated the patient and found that they have improved  Co morbidities that complicate the patient evaluation  Past Medical History:  Diagnosis Date   Anemia    Diabetes mellitus without complication (HCC)       Dispostion: Disposition decision including need for hospitalization was considered, and patient admitted to the hospital.    Final Clinical Impression(s) / ED Diagnoses Final diagnoses:  Symptomatic cholelithiasis     This chart was dictated using voice recognition software.  Despite best efforts to proofread,  errors can occur which can change the documentation meaning.    Tanda Rockers A, DO 12/09/22 1114

## 2022-12-08 NOTE — Progress Notes (Signed)
Npo started at midnight.

## 2022-12-08 NOTE — H&P (Signed)
Surgical Evaluation  Chief Complaint: Abdominal pain  HPI: 57 year old woman with history of obesity, diabetes, hypertension, hyperlipidemia, tobacco use and alcohol use who presented to the emergency room this morning with epigastric and right upper quadrant pain and associated nausea.  She has intermittently had similar symptoms for at least 2 years, but this has become more consistent over the last few days and subsequently worsened in the last 24 hours.  This episode worsened acutely this morning and became more of a sharp and stabbing sensation.  No known fevers, no jaundice.   No previous abdominal surgery.  No Known Allergies  Past Medical History:  Diagnosis Date   Diabetes mellitus without complication (HCC)     History reviewed. No pertinent surgical history.  Family History  Problem Relation Age of Onset   Cancer Sister        "bones".  another sister-uterin   Diabetes Sister     Social History   Socioeconomic History   Marital status: Legally Separated    Spouse name: Not on file   Number of children: 6   Years of education: Not on file   Highest education level: Not on file  Occupational History   Not on file  Tobacco Use   Smoking status: Every Day    Packs/day: .5    Types: Cigarettes   Smokeless tobacco: Current  Vaping Use   Vaping Use: Never used  Substance and Sexual Activity   Alcohol use: Yes    Alcohol/week: 7.0 standard drinks of alcohol    Types: 7 Cans of beer per week    Comment: 4-5 beers one day/wk   Drug use: No   Sexual activity: Yes  Other Topics Concern   Not on file  Social History Narrative   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   Current  Outpatient Medications on File Prior to Encounter  Medication Sig Dispense Refill   atorvastatin (LIPITOR) 20 MG tablet Take 1 tablet (20 mg total) by mouth daily. 90 tablet 0   glucose blood test strip Use as instructed 100 each 12   ketoconazole (NIZORAL) 2 % cream Apply 1 Application topically 2 (two) times daily. 60 g 1   losartan (COZAAR) 100 MG tablet Take 1 tablet (100 mg total) by mouth daily. 90 tablet 1   meloxicam (MOBIC) 7.5 MG tablet Take 1 tablet (7.5 mg total) by mouth daily. 15 tablet 0   metFORMIN (GLUCOPHAGE) 1000 MG tablet Take 1 tablet (1,000 mg total) by mouth 2 (two) times daily with a meal. 180 tablet 3   omeprazole (PRILOSEC) 40 MG capsule TAKE 1 CAPSULE(40 MG) BY MOUTH DAILY 90 capsule 0   OneTouch UltraSoft 2 Lancets MISC 1 each by Does not apply route 2 (two) times daily as needed. 100 each 1    Review of Systems: a complete, 10pt review of systems was completed with pertinent positives and negatives as documented in the HPI  Physical Exam: Vitals:   12/08/22 1315 12/08/22 1430  BP: (!) 159/81 (!) 149/87  Pulse: (!) 106 98  Resp: 18 12  Temp:  98.4 F (36.9 C)  SpO2: 95% 98%   Gen: A&Ox3, no distress  Chest: respiratory effort is normal.  Cardiovascular: RRR with palpable distal pulses, no pedal edema Gastrointestinal: soft, nondistended, tender in the epigastrium and right upper quadrant with mild subjective tenderness diffusely. Muscoloskeletal: no clubbing or cyanosis of the fingers.  Strength is symmetrical throughout.  Range of motion of bilateral upper and lower extremities normal without pain, crepitation or contracture. Neuro: cranial nerves grossly intact.  Sensation intact to light touch diffusely. Psych: appropriate mood and affect, normal insight/judgment intact  Skin: warm and dry      Latest Ref Rng & Units 12/08/2022   10:39 AM 09/25/2022    8:54 AM 04/30/2022    9:14 AM  CBC  WBC 4.0 - 10.5 K/uL 10.2  4.5  5.3   Hemoglobin 12.0 - 15.0  g/dL 82.9  56.2  13.0   Hematocrit 36.0 - 46.0 % 42.1  45.1  44.6   Platelets 150 - 400 K/uL 262  252.0  242.0        Latest Ref Rng & Units 12/08/2022   10:39 AM 09/25/2022    8:54 AM 04/30/2022    9:14 AM  CMP  Glucose 70 - 99 mg/dL 865  784  696   BUN 6 - 20 mg/dL 9  8  10    Creatinine 0.44 - 1.00 mg/dL 2.95  2.84  1.32   Sodium 135 - 145 mmol/L 140  144  140   Potassium 3.5 - 5.1 mmol/L 3.5  4.0  3.7   Chloride 98 - 111 mmol/L 107  106  103   CO2 22 - 32 mmol/L 24  28  28    Calcium 8.9 - 10.3 mg/dL 8.9  9.5  9.7   Total Protein 6.5 - 8.1 g/dL 7.2  6.7  7.2   Total Bilirubin 0.3 - 1.2 mg/dL 0.6  0.4  0.7   Alkaline Phos 38 - 126 U/L 135  118  114   AST 15 - 41 U/L 95  73  34   ALT 0 - 44 U/L 55  89  29     No results found for: "INR", "PROTIME"  Imaging: US Abdomen Limited RUQ (LIVER/GB)  Result Date: 12/08/2022 CLINICAL DATA:  Right upper quadrant pain EXAM: ULTRASOUND ABDOMEN LIMITED RIGHT UPPER QUADRANT COMPARISON:  CT scan of the abdomen pelvis December 08, 2022 FINDINGS: Gallbladder: A small amount of pericholecystic fluid is again identified. No gallbladder wall thickening. A rounded hyperechoic mildly shadowing region is identified in the gallbladder measuring 6 mm. This structure is non mobile throughout the study. No other evidence of stones. A positive Murphy's sign is reported. Common bile duct: Diameter: 3.5 mm Liver: Diffuse increased echogenicity. No focal mass. Portal vein is patent on color Doppler imaging with normal direction of blood flow towards the liver. Other: None. IMPRESSION: 1. A small amount of pericholecystic fluid is again identified. A positive Murphy's sign is reported. No gallbladder wall thickening. A 6 mm rounded hyperechoic mildly shadowing structure is identified in the gallbladder. This structure is non mobile throughout the study. This could represent a small stone adherent to the gallbladder wall or a polyp. If the clinical picture remains ambiguous and  acute cholecystitis is a concern, recommend a HIDA scan. If the patient's gallbladder is not removed, recommend a 12 month follow-up ultrasound given the possibility of a 6 mm polyp. 2. Hepatic steatosis. Electronically Signed   By: Gerome Sam III M.D.   On: 12/08/2022 14:36   CT ABDOMEN PELVIS W CONTRAST  Result Date: 12/08/2022  CLINICAL DATA:  Epigastric pain EXAM: CT ABDOMEN AND PELVIS WITH CONTRAST TECHNIQUE: Multidetector CT imaging of the abdomen and pelvis was performed using the standard protocol following bolus administration of intravenous contrast. RADIATION DOSE REDUCTION: This exam was performed according to the departmental dose-optimization program which includes automated exposure control, adjustment of the mA and/or kV according to patient size and/or use of iterative reconstruction technique. CONTRAST:  75mL OMNIPAQUE IOHEXOL 350 MG/ML SOLN COMPARISON:  Ultrasound 10/30/2022 FINDINGS: Lower chest: Left basilar scarring or atelectasis. Small hiatal hernia. Heart size is normal. Hepatobiliary: Diffusely decreased attenuation of the hepatic parenchyma. No focal liver lesion is identified. Small stone or polyp within the gallbladder fundus (series 6, image 21). Trace pericholecystic edema. No fat stranding or appreciable wall thickening. No biliary dilatation. Pancreas: Unremarkable. No pancreatic ductal dilatation or surrounding inflammatory changes. Spleen: Normal in size without focal abnormality. Adrenals/Urinary Tract: Unremarkable adrenal glands. 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 Kidneys enhance symmetrically without focal lesion, stone, or hydronephrosis. Ureters are nondilated. Urinary bladder appears unremarkable for the degree of distention. Stomach/Bowel: Small hiatal hernia. Stomach is otherwise within normal limits. Appendix appears normal (series 3, image 65). No evidence of bowel wall thickening, distention, or inflammatory changes. Vascular/Lymphatic:  No significant vascular findings are present. There are a few mildly prominent mesenteric lymph nodes on the right, largest measuring 9 mm short axis (series 3, image 45). No pathologically enlarged abdominal or pelvic lymph nodes by size criteria. Reproductive: Uterus and bilateral adnexa are unremarkable. Other: No free fluid. No abdominopelvic fluid collection. No pneumoperitoneum. No abdominal wall hernia. Musculoskeletal: No acute or significant osseous findings. IMPRESSION: 1. Small stone or polyp within the gallbladder fundus with trace pericholecystic edema. No fat stranding or appreciable wall thickening. If there is concern for acute cholecystitis, right upper quadrant ultrasound is recommended. 2. Hepatic steatosis. 3. Small hiatal hernia. 4. Mildly prominent mesenteric lymph nodes on the right, which may be reactive. Electronically Signed   By: Duanne Guess D.O.   On: 12/08/2022 13:18     A/P: Acute on chronic cholecystitis.  I reviewed her CT images and ultrasound personally, these show dilated gallbladder with pericholecystic fluid, what appears to be a 6mm gallbladder polyp vs a stone but no other appreciable stones; interestingly she had an ultrasound in April which does show cholelithiasis and likely hepatic steatosis.  I recommend proceeding with laparoscopic cholecystectomy with possible cholangiogram.  I described the relevant anatomy and surgical technique, and we discussed risks of surgery including bleeding, pain, scarring, intraabdominal injury specifically to the common bile duct and sequelae, bile leak, conversion to open surgery, subtotal cholecystectomy, blood clot, pneumonia, heart attack, stroke, failure to resolve symptoms, etc. Questions welcomed and answered.  Will tentatively plan for surgery tomorrow pending OR availability.    Patient Active Problem List   Diagnosis Date Noted   Hyperlipidemia associated with type 2 diabetes mellitus (HCC) 05/28/2022   Type 2  diabetes mellitus with hyperglycemia, without long-term current use of insulin (HCC) 03/30/2022   Primary hypertension 03/30/2022   Endometrial mass 12/26/2015   IUD complication (HCC) 12/26/2015   Smoker 11/10/2014       Phylliss Blakes, MD [facility]  See AMION to contact appropriate on-call provider  [facility]

## 2022-12-08 NOTE — ED Notes (Signed)
Pt ambulated to RR .  

## 2022-12-08 NOTE — ED Notes (Signed)
.ED TO INPATIENT HANDOFF REPORT  ED Nurse Name and Phone #: [phone]  S Name/Age/Gender Kathy Lowe 57 y.o. female Room/Bed: 004C/004C  Code Status   Code Status: Full Code  Home/SNF/Other Home Patient oriented to: self, place, time, and situation Is this baseline? Yes   Triage Complete: Triage complete  Chief Complaint Cholecystitis [K81.9]  Triage Note Pt came to ED for upper abd pain and flank pain that started Tuesday. Pt states she has gallbladder. Axox4. Denies n/v   Allergies No Known Allergies  Level of Care/Admitting Diagnosis ED Disposition     ED Disposition  Admit   Condition  --   Comment  Hospital Area: [facility] [[id number]]  Level of Care: Med-Surg [16]  May place patient in observation at [facility] or [facility] if equivalent level of care is available:: No  Covid Evaluation: Asymptomatic - no recent exposure (last 10 days) testing not required  Diagnosis: Cholecystitis [960454]  Admitting Physician: CCS, MD [[id number]  Attending Physician: CCS, MD [[id number]]          B Medical/Surgery History Past Medical History:  Diagnosis Date   Diabetes mellitus without complication (HCC)    History reviewed. No pertinent surgical history.   A IV Location/Drains/Wounds Patient Lines/Drains/Airways Status     Active Line/Drains/Airways     Name Placement date Placement time Site Days   Peripheral IV 12/08/22 20 G Right Antecubital 12/08/22  1239  Antecubital  less than 1            Intake/Output Last 24 hours No intake or output data in the 24 hours ending 12/08/22 1646  Labs/Imaging Results for orders placed or performed during the hospital encounter of 12/08/22 (from the past 48 hour(s))  Lipase, blood     Status: None   Collection Time: 12/08/22 10:39 AM  Result Value Ref Range   Lipase 28 11 - 51 U/L    Comment: Performed at [facility], [address] [place], [place] [address]  Comprehensive metabolic panel      Status: Abnormal   Collection Time: 12/08/22 10:39 AM  Result Value Ref Range   Sodium 140 135 - 145 mmol/L   Potassium 3.5 3.5 - 5.1 mmol/L   Chloride 107 98 - 111 mmol/L   CO2 24 22 - 32 mmol/L   Glucose, Bld 187 (H) 70 - 99 mg/dL    Comment: Glucose reference range applies only to samples taken after fasting for at least 8 hours.   BUN 9 6 - 20 mg/dL   Creatinine, Ser 9.14 0.44 - 1.00 mg/dL   Calcium 8.9 8.9 - 78.2 mg/dL   Total Protein 7.2 6.5 - 8.1 g/dL   Albumin 4.0 3.5 - 5.0 g/dL   AST 95 (H) 15 - 41 U/L   ALT 55 (H) 0 - 44 U/L   Alkaline Phosphatase 135 (H) 38 - 126 U/L   Total Bilirubin 0.6 0.3 - 1.2 mg/dL   GFR, Estimated >95 >62 mL/min    Comment: (NOTE) Calculated using the CKD-EPI Creatinine Equation (2021)    Anion gap 9 5 - 15    Comment: Performed at [facility], [address] [place], [place] [address]  CBC     Status: None   Collection Time: 12/08/22 10:39 AM  Result Value Ref Range   WBC 10.2 4.0 - 10.5 K/uL   RBC 4.50 3.87 - 5.11 MIL/uL   Hemoglobin 14.1 12.0 - 15.0 g/dL  HCT 42.1 36.0 - 46.0 %   MCV 93.6 80.0 - 100.0 fL   MCH 31.3 26.0 - 34.0 pg   MCHC 33.5 30.0 - 36.0 g/dL   RDW 09.8 11.9 - 14.7 %   Platelets 262 150 - 400 K/uL   nRBC 0.0 0.0 - 0.2 %    Comment: Performed at [facility], [address] [place], [place] [address]  Urinalysis, Routine w reflex microscopic -Urine, Clean Catch     Status: Abnormal   Collection Time: 12/08/22 11:06 AM  Result Value Ref Range   Color, Urine YELLOW YELLOW   APPearance HAZY (A) CLEAR   Specific Gravity, Urine 1.017 1.005 - 1.030   pH 5.0 5.0 - 8.0   Glucose, UA NEGATIVE NEGATIVE mg/dL   Hgb urine dipstick SMALL (A) NEGATIVE   Bilirubin Urine NEGATIVE NEGATIVE   Ketones, ur NEGATIVE NEGATIVE mg/dL   Protein, ur NEGATIVE NEGATIVE mg/dL   Nitrite NEGATIVE NEGATIVE   Leukocytes,Ua NEGATIVE NEGATIVE   RBC / HPF 0-5 0 - 5 RBC/hpf   WBC, UA 0-5 0 - 5 WBC/hpf   Bacteria, UA RARE (A)  NONE SEEN   Squamous Epithelial / HPF 6-10 0 - 5 /HPF   Mucus PRESENT     Comment: Performed at [facility], [address] [place], [place] [address]   US Abdomen Limited RUQ (LIVER/GB)  Result Date: 12/08/2022 CLINICAL DATA:  Right upper quadrant pain EXAM: ULTRASOUND ABDOMEN LIMITED RIGHT UPPER QUADRANT COMPARISON:  CT scan of the abdomen pelvis December 08, 2022 FINDINGS: Gallbladder: A small amount of pericholecystic fluid is again identified. No gallbladder wall thickening. A rounded hyperechoic mildly shadowing region is identified in the gallbladder measuring 6 mm. This structure is non mobile throughout the study. No other evidence of stones. A positive Murphy's sign is reported. Common bile duct: Diameter: 3.5 mm Liver: Diffuse increased echogenicity. No focal mass. Portal vein is patent on color Doppler imaging with normal direction of blood flow towards the liver. Other: None. IMPRESSION: 1. A small amount of pericholecystic fluid is again identified. A positive Murphy's sign is reported. No gallbladder wall thickening. A 6 mm rounded hyperechoic mildly shadowing structure is identified in the gallbladder. This structure is non mobile throughout the study. This could represent a small stone adherent to the gallbladder wall or a polyp. If the clinical picture remains ambiguous and acute cholecystitis is a concern, recommend a HIDA scan. If the patient's gallbladder is not removed, recommend a 12 month follow-up ultrasound given the possibility of a 6 mm polyp. 2. Hepatic steatosis. Electronically Signed   By: Gerome Sam III M.D.   On: 12/08/2022 14:36   CT ABDOMEN PELVIS W CONTRAST  Result Date: 12/08/2022 CLINICAL DATA:  Epigastric pain EXAM: CT ABDOMEN AND PELVIS WITH CONTRAST TECHNIQUE: Multidetector CT imaging of the abdomen and pelvis was performed using the standard protocol following bolus administration of intravenous contrast. RADIATION DOSE REDUCTION: This exam was performed  according to the departmental dose-optimization program which includes automated exposure control, adjustment of the mA and/or kV according to patient size and/or use of iterative reconstruction technique. CONTRAST:  75mL OMNIPAQUE IOHEXOL 350 MG/ML SOLN COMPARISON:  Ultrasound 10/30/2022 FINDINGS: Lower chest: Left basilar scarring or atelectasis. Small hiatal hernia. Heart size is normal. Hepatobiliary: Diffusely decreased attenuation of the hepatic parenchyma. No focal liver lesion is identified. Small stone or polyp within the gallbladder fundus (series 6, image 21). Trace pericholecystic edema. No fat stranding or appreciable wall  thickening. No biliary dilatation. Pancreas: Unremarkable. No pancreatic ductal dilatation or surrounding inflammatory changes. Spleen: Normal in size without focal abnormality. Adrenals/Urinary Tract: Unremarkable adrenal glands. 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 Kidneys enhance symmetrically without focal lesion, stone, or hydronephrosis. Ureters are nondilated. Urinary bladder appears unremarkable for the degree of distention. Stomach/Bowel: Small hiatal hernia. Stomach is otherwise within normal limits. Appendix appears normal (series 3, image 65). No evidence of bowel wall thickening, distention, or inflammatory changes. Vascular/Lymphatic: No significant vascular findings are present. There are a few mildly prominent mesenteric lymph nodes on the right, largest measuring 9 mm short axis (series 3, image 45). No pathologically enlarged abdominal or pelvic lymph nodes by size criteria. Reproductive: Uterus and bilateral adnexa are unremarkable. Other: No free fluid. No abdominopelvic fluid collection. No pneumoperitoneum. No abdominal wall hernia. Musculoskeletal: No acute or significant osseous findings. IMPRESSION: 1. Small stone or polyp within the gallbladder fundus with trace pericholecystic edema. No fat stranding or appreciable wall thickening. If  there is concern for acute cholecystitis, right upper quadrant ultrasound is recommended. 2. Hepatic steatosis. 3. Small hiatal hernia. 4. Mildly prominent mesenteric lymph nodes on the right, which may be reactive. Electronically Signed   By: Duanne Guess D.O.   On: 12/08/2022 13:18    Pending Labs Unresulted Labs (From admission, onward)     Start     Ordered   12/09/22 0500  Comprehensive metabolic panel  Tomorrow morning,   R        12/08/22 1644   12/09/22 0500  Magnesium  Tomorrow morning,   R        12/08/22 1644   12/09/22 0500  CBC  Tomorrow morning,   R        12/08/22 1644   12/08/22 1645  Hemoglobin A1c  Once,   R       Comments: To assess prior glycemic control    12/08/22 1645   12/08/22 1644  HIV Antibody (routine testing w rflx)  (HIV Antibody (Routine testing w reflex) panel)  Once,   R        12/08/22 1644            Vitals/Pain Today's Vitals   12/08/22 1600 12/08/22 1615 12/08/22 1618 12/08/22 1619  BP: (!) 146/87 (!) 157/89    Pulse: (!) 110 (!) 108 97 98  Resp: 17 16 16 14   Temp:      TempSrc:      SpO2: 97% 99% 98% 97%  Height:      PainSc:        Isolation Precautions No active isolations  Medications Medications  enoxaparin (LOVENOX) injection 40 mg (has no administration in time range)  0.9 %  sodium chloride infusion (has no administration in time range)  cefTRIAXone (ROCEPHIN) 2 g in sodium chloride 0.9 % 100 mL IVPB (has no administration in time range)  acetaminophen (TYLENOL) tablet 1,000 mg (has no administration in time range)  traMADol (ULTRAM) tablet 50 mg (has no administration in time range)  ibuprofen (ADVIL) tablet 600 mg (has no administration in time range)  oxyCODONE (Oxy IR/ROXICODONE) immediate release tablet 5 mg (has no administration in time range)  HYDROmorphone (DILAUDID) injection 0.5 mg (has no administration in time range)  methocarbamol (ROBAXIN) tablet 500 mg (has no administration in time range)    Or   methocarbamol (ROBAXIN) 500 mg in dextrose 5 % 50 mL IVPB (has no administration in time range)  melatonin tablet 3 mg (has  no administration in time range)  diphenhydrAMINE (BENADRYL) capsule 25 mg (has no administration in time range)    Or  diphenhydrAMINE (BENADRYL) injection 25 mg (has no administration in time range)  docusate sodium (COLACE) capsule 100 mg (has no administration in time range)  bisacodyl (DULCOLAX) suppository 10 mg (has no administration in time range)  ondansetron (ZOFRAN-ODT) disintegrating tablet 4 mg (has no administration in time range)    Or  ondansetron (ZOFRAN) injection 4 mg (has no administration in time range)  metoprolol tartrate (LOPRESSOR) injection 5 mg (has no administration in time range)  hydrALAZINE (APRESOLINE) injection 10 mg (has no administration in time range)  pantoprazole (PROTONIX) EC tablet 40 mg (has no administration in time range)  insulin aspart (novoLOG) injection 0-15 Units (has no administration in time range)  alum & mag hydroxide-simeth (MAALOX/MYLANTA) 200-200-20 MG/5ML suspension 30 mL (30 mLs Oral Given 12/08/22 1240)  ondansetron (ZOFRAN) injection 4 mg (4 mg Intravenous Given 12/08/22 1239)  sodium chloride 0.9 % bolus 1,000 mL (0 mLs Intravenous Stopped 12/08/22 1400)  iohexol (OMNIPAQUE) 350 MG/ML injection 75 mL (75 mLs Intravenous Contrast Given 12/08/22 1309)  ketorolac (TORADOL) 15 MG/ML injection 15 mg (15 mg Intravenous Given 12/08/22 1605)    Mobility walks     Focused Assessments Neuro Assessment Handoff:  Swallow screen pass? Yes          Neuro Assessment: Within Defined Limits Neuro Checks:      Has TPA been given? No If patient is a Neuro Trauma and patient is going to OR before floor call report to 4N Charge nurse: [phone] or [phone]   R Recommendations: See Admitting Provider Note  Report given to:   Additional Notes: Pt Aox4, walkie talkie, abd pain mid epigastric. Spanish speaking, son  at bedside speaks english

## 2022-12-08 NOTE — ED Triage Notes (Signed)
Pt came to ED for upper abd pain and flank pain that started Tuesday. Pt states she has gallbladder. Axox4. Denies n/v

## 2022-12-09 ENCOUNTER — Encounter (HOSPITAL_COMMUNITY): Payer: Self-pay

## 2022-12-09 ENCOUNTER — Encounter (HOSPITAL_COMMUNITY): Admission: EM | Disposition: A | Discharge status: Home / Self Care | Payer: Self-pay | Source: Home / Self Care

## 2022-12-09 ENCOUNTER — Observation Stay (HOSPITAL_COMMUNITY): Payer: Medicaid Other | Admitting: Anesthesiology

## 2022-12-09 ENCOUNTER — Observation Stay (HOSPITAL_BASED_OUTPATIENT_CLINIC_OR_DEPARTMENT_OTHER): Payer: Medicaid Other | Admitting: Anesthesiology

## 2022-12-09 DIAGNOSIS — E119 Type 2 diabetes mellitus without complications: Secondary | ICD-10-CM | POA: Diagnosis not present

## 2022-12-09 DIAGNOSIS — I10 Essential (primary) hypertension: Secondary | ICD-10-CM | POA: Diagnosis not present

## 2022-12-09 DIAGNOSIS — F17210 Nicotine dependence, cigarettes, uncomplicated: Secondary | ICD-10-CM | POA: Diagnosis not present

## 2022-12-09 DIAGNOSIS — K810 Acute cholecystitis: Secondary | ICD-10-CM

## 2022-12-09 DIAGNOSIS — Z7984 Long term (current) use of oral hypoglycemic drugs: Secondary | ICD-10-CM

## 2022-12-09 HISTORY — PX: CHOLECYSTECTOMY: SHX55

## 2022-12-09 LAB — GLUCOSE, CAPILLARY
Glucose-Capillary: 123 mg/dL — ABNORMAL HIGH (ref 70–99)
Glucose-Capillary: 138 mg/dL — ABNORMAL HIGH (ref 70–99)
Glucose-Capillary: 140 mg/dL — ABNORMAL HIGH (ref 70–99)
Glucose-Capillary: 150 mg/dL — ABNORMAL HIGH (ref 70–99)
Glucose-Capillary: 182 mg/dL — ABNORMAL HIGH (ref 70–99)
Glucose-Capillary: 189 mg/dL — ABNORMAL HIGH (ref 70–99)
Glucose-Capillary: 201 mg/dL — ABNORMAL HIGH (ref 70–99)
Glucose-Capillary: 232 mg/dL — ABNORMAL HIGH (ref 70–99)

## 2022-12-09 LAB — COMPREHENSIVE METABOLIC PANEL
ALT: 82 U/L — ABNORMAL HIGH (ref 0–44)
AST: 111 U/L — ABNORMAL HIGH (ref 15–41)
Albumin: 3.4 g/dL — ABNORMAL LOW (ref 3.5–5.0)
Alkaline Phosphatase: 149 U/L — ABNORMAL HIGH (ref 38–126)
Anion gap: 10 (ref 5–15)
BUN: <5 mg/dL — ABNORMAL LOW (ref 6–20)
CO2: 23 mmol/L (ref 22–32)
Calcium: 8.4 mg/dL — ABNORMAL LOW (ref 8.9–10.3)
Chloride: 105 mmol/L (ref 98–111)
Creatinine, Ser: 0.63 mg/dL (ref 0.44–1.00)
GFR, Estimated: >60 mL/min (ref >60)
Glucose, Bld: 141 mg/dL — ABNORMAL HIGH (ref 70–99)
Potassium: 3.1 mmol/L — ABNORMAL LOW (ref 3.5–5.1)
Sodium: 138 mmol/L (ref 135–145)
Total Bilirubin: 0.9 mg/dL (ref 0.3–1.2)
Total Protein: 6.6 g/dL (ref 6.5–8.1)

## 2022-12-09 LAB — CBC
HCT: 38.9 % (ref 36.0–46.0)
Hemoglobin: 13.1 g/dL (ref 12.0–15.0)
MCH: 31 pg (ref 26.0–34.0)
MCHC: 33.7 g/dL (ref 30.0–36.0)
MCV: 92 fL (ref 80.0–100.0)
Platelets: 240 10*3/uL (ref 150–400)
RBC: 4.23 MIL/uL (ref 3.87–5.11)
RDW: 13 % (ref 11.5–15.5)
WBC: 9.5 10*3/uL (ref 4.0–10.5)
nRBC: 0 % (ref 0.0–0.2)

## 2022-12-09 LAB — MAGNESIUM: Magnesium: 1.8 mg/dL (ref 1.7–2.4)

## 2022-12-09 SURGERY — LAPAROSCOPIC CHOLECYSTECTOMY
Anesthesia: General

## 2022-12-09 MED ORDER — 0.9 % SODIUM CHLORIDE (POUR BTL) OPTIME
TOPICAL | Status: DC | PRN
  Administered 2022-12-09: 1000 mL

## 2022-12-09 MED ORDER — PROMETHAZINE HCL 25 MG/ML IJ SOLN
INTRAMUSCULAR | Status: AC
  Filled 2022-12-09: qty 1

## 2022-12-09 MED ORDER — CHLORHEXIDINE GLUCONATE 0.12 % MT SOLN: 15.0000 mL | Freq: Once | OROMUCOSAL | Status: AC

## 2022-12-09 MED ORDER — HYDROMORPHONE HCL 1 MG/ML IJ SOLN
INTRAMUSCULAR | Status: AC
  Filled 2022-12-09: qty 1

## 2022-12-09 MED ORDER — MIDAZOLAM HCL 2 MG/2ML IJ SOLN
INTRAMUSCULAR | Status: DC | PRN
  Administered 2022-12-09: 2 mg via INTRAVENOUS

## 2022-12-09 MED ORDER — OXYCODONE HCL 5 MG/5ML PO SOLN: 5.0000 mg | Freq: Once | ORAL | Status: DC | PRN

## 2022-12-09 MED ORDER — ESMOLOL HCL 100 MG/10ML IV SOLN
INTRAVENOUS | Status: DC | PRN
  Administered 2022-12-09: 10 mg via INTRAVENOUS
  Administered 2022-12-09: 20 mg via INTRAVENOUS

## 2022-12-09 MED ORDER — ORAL CARE MOUTH RINSE: 15.0000 mL | Freq: Once | OROMUCOSAL | Status: AC

## 2022-12-09 MED ORDER — CEFAZOLIN SODIUM-DEXTROSE 2-3 GM-%(50ML) IV SOLR
INTRAVENOUS | Status: DC | PRN
  Administered 2022-12-09: 2 g via INTRAVENOUS

## 2022-12-09 MED ORDER — FENTANYL CITRATE (PF) 250 MCG/5ML IJ SOLN
INTRAMUSCULAR | Status: AC
  Filled 2022-12-09: qty 5

## 2022-12-09 MED ORDER — ROCURONIUM BROMIDE 10 MG/ML (PF) SYRINGE
PREFILLED_SYRINGE | INTRAVENOUS | Status: DC | PRN
  Administered 2022-12-09: 60 mg via INTRAVENOUS

## 2022-12-09 MED ORDER — INSULIN ASPART 100 UNIT/ML IJ SOLN
0.0000 [IU] | INTRAMUSCULAR | Status: DC | PRN
  Administered 2022-12-09: 2 [IU] via SUBCUTANEOUS

## 2022-12-09 MED ORDER — SODIUM CHLORIDE 0.9 % IR SOLN
Status: DC | PRN
  Administered 2022-12-09: 1000 mL

## 2022-12-09 MED ORDER — MEPERIDINE HCL 25 MG/ML IJ SOLN: 6.2500 mg | INTRAMUSCULAR | Status: DC | PRN

## 2022-12-09 MED ORDER — METHOCARBAMOL 1000 MG/10ML IJ SOLN: 500.0000 mg | Freq: Four times a day (QID) | INTRAVENOUS | Status: DC | PRN

## 2022-12-09 MED ORDER — OXIDIZED CELLULOSE EX PADS
MEDICATED_PAD | CUTANEOUS | Status: DC | PRN
  Administered 2022-12-09: 1 via TOPICAL

## 2022-12-09 MED ORDER — HYDROMORPHONE HCL 1 MG/ML IJ SOLN: 0.5000 mg | INTRAMUSCULAR | Status: DC | PRN

## 2022-12-09 MED ORDER — CHLORHEXIDINE GLUCONATE 0.12 % MT SOLN
OROMUCOSAL | Status: AC
  Administered 2022-12-09: 15 mL via OROMUCOSAL
  Filled 2022-12-09: qty 15

## 2022-12-09 MED ORDER — CHLORHEXIDINE GLUCONATE 0.12 % MT SOLN
OROMUCOSAL | Status: AC
  Filled 2022-12-09: qty 15

## 2022-12-09 MED ORDER — FENTANYL CITRATE (PF) 250 MCG/5ML IJ SOLN
INTRAMUSCULAR | Status: DC | PRN
  Administered 2022-12-09: 100 ug via INTRAVENOUS
  Administered 2022-12-09: 50 ug via INTRAVENOUS

## 2022-12-09 MED ORDER — MIDAZOLAM HCL 2 MG/2ML IJ SOLN: 0.5000 mg | Freq: Once | INTRAMUSCULAR | Status: DC | PRN

## 2022-12-09 MED ORDER — PROPOFOL 10 MG/ML IV BOLUS
INTRAVENOUS | Status: DC | PRN
  Administered 2022-12-09: 50 mg via INTRAVENOUS
  Administered 2022-12-09: 150 mg via INTRAVENOUS

## 2022-12-09 MED ORDER — BUPIVACAINE-EPINEPHRINE 0.25% -1:200000 IJ SOLN
INTRAMUSCULAR | Status: DC | PRN
  Administered 2022-12-09: 30 mL

## 2022-12-09 MED ORDER — SUCCINYLCHOLINE CHLORIDE 200 MG/10ML IV SOSY
PREFILLED_SYRINGE | INTRAVENOUS | Status: AC
  Filled 2022-12-09: qty 10

## 2022-12-09 MED ORDER — PROPOFOL 10 MG/ML IV BOLUS
INTRAVENOUS | Status: AC
  Filled 2022-12-09: qty 20

## 2022-12-09 MED ORDER — DEXAMETHASONE SODIUM PHOSPHATE 10 MG/ML IJ SOLN
INTRAMUSCULAR | Status: AC
  Filled 2022-12-09: qty 1

## 2022-12-09 MED ORDER — LIDOCAINE 2% (20 MG/ML) 5 ML SYRINGE
INTRAMUSCULAR | Status: DC | PRN
  Administered 2022-12-09: 20 mg via INTRAVENOUS

## 2022-12-09 MED ORDER — ROCURONIUM BROMIDE 10 MG/ML (PF) SYRINGE
PREFILLED_SYRINGE | INTRAVENOUS | Status: AC
  Filled 2022-12-09: qty 10

## 2022-12-09 MED ORDER — SUGAMMADEX SODIUM 200 MG/2ML IV SOLN
INTRAVENOUS | Status: DC | PRN
  Administered 2022-12-09: 200 mg via INTRAVENOUS

## 2022-12-09 MED ORDER — ONDANSETRON HCL 4 MG/2ML IJ SOLN
INTRAMUSCULAR | Status: AC
  Filled 2022-12-09: qty 2

## 2022-12-09 MED ORDER — LACTATED RINGERS IV SOLN: INTRAVENOUS | Status: DC

## 2022-12-09 MED ORDER — DEXAMETHASONE SODIUM PHOSPHATE 10 MG/ML IJ SOLN
INTRAMUSCULAR | Status: DC | PRN
  Administered 2022-12-09: 5 mg via INTRAVENOUS

## 2022-12-09 MED ORDER — LIDOCAINE 2% (20 MG/ML) 5 ML SYRINGE
INTRAMUSCULAR | Status: AC
  Filled 2022-12-09: qty 5

## 2022-12-09 MED ORDER — ONDANSETRON HCL 4 MG/2ML IJ SOLN
INTRAMUSCULAR | Status: DC | PRN
  Administered 2022-12-09: 4 mg via INTRAVENOUS

## 2022-12-09 MED ORDER — MIDAZOLAM HCL 2 MG/2ML IJ SOLN
INTRAMUSCULAR | Status: AC
  Filled 2022-12-09: qty 2

## 2022-12-09 MED ORDER — HYDROMORPHONE HCL 1 MG/ML IJ SOLN
0.2500 mg | INTRAMUSCULAR | Status: DC | PRN
  Administered 2022-12-09 (×3): 0.5 mg via INTRAVENOUS

## 2022-12-09 MED ORDER — PROMETHAZINE HCL 25 MG/ML IJ SOLN
6.2500 mg | INTRAMUSCULAR | Status: DC | PRN
  Administered 2022-12-09: 6.25 mg via INTRAVENOUS

## 2022-12-09 MED ORDER — BUPIVACAINE-EPINEPHRINE (PF) 0.25% -1:200000 IJ SOLN
INTRAMUSCULAR | Status: AC
  Filled 2022-12-09: qty 30

## 2022-12-09 MED ORDER — GABAPENTIN 300 MG PO CAPS
300.0000 mg | ORAL_CAPSULE | Freq: Three times a day (TID) | ORAL | Status: DC
  Administered 2022-12-09 – 2022-12-11 (×5): 300 mg via ORAL
  Filled 2022-12-09 (×5): qty 1

## 2022-12-09 MED ORDER — OXYCODONE HCL 5 MG PO TABS: 5.0000 mg | ORAL_TABLET | Freq: Once | ORAL | Status: DC | PRN

## 2022-12-09 SURGICAL SUPPLY — 41 items
ADH SKN CLS APL DERMABOND .7 (GAUZE/BANDAGES/DRESSINGS) ×1
APL PRP STRL LF DISP 70% ISPRP (MISCELLANEOUS) ×1
APPLIER CLIP 5 13 M/L LIGAMAX5 (MISCELLANEOUS) ×1
APR CLP MED LRG 5 ANG JAW (MISCELLANEOUS) ×1
BAG COUNTER SPONGE SURGICOUNT (BAG) ×1 IMPLANT
BAG SPNG CNTER NS LX DISP (BAG) ×1
BLADE CLIPPER SURG (BLADE) IMPLANT
CANISTER SUCT 3000ML PPV (MISCELLANEOUS) ×1 IMPLANT
CHLORAPREP W/TINT 26 (MISCELLANEOUS) ×1 IMPLANT
CLIP APPLIE 5 13 M/L LIGAMAX5 (MISCELLANEOUS) ×1 IMPLANT
COVER SURGICAL LIGHT HANDLE (MISCELLANEOUS) ×1 IMPLANT
DERMABOND ADVANCED .7 DNX12 (GAUZE/BANDAGES/DRESSINGS) ×1 IMPLANT
ELECT REM PT RETURN 9FT ADLT (ELECTROSURGICAL) ×1
ELECTRODE REM PT RTRN 9FT ADLT (ELECTROSURGICAL) ×1 IMPLANT
GLOVE BIO SURGEON STRL SZ 6 (GLOVE) ×1 IMPLANT
GLOVE INDICATOR 6.5 STRL GRN (GLOVE) ×1 IMPLANT
GOWN STRL REUS W/ TWL LRG LVL3 (GOWN DISPOSABLE) ×3 IMPLANT
GOWN STRL REUS W/TWL LRG LVL3 (GOWN DISPOSABLE) ×3
GRASPER SUT TROCAR 14GX15 (MISCELLANEOUS) ×1 IMPLANT
IRRIG SUCT STRYKERFLOW 2 WTIP (MISCELLANEOUS) ×1
IRRIGATION SUCT STRKRFLW 2 WTP (MISCELLANEOUS) ×1 IMPLANT
KIT BASIN OR (CUSTOM PROCEDURE TRAY) ×1 IMPLANT
KIT TURNOVER KIT B (KITS) ×1 IMPLANT
NDL INSUFFLATION 14GA 120MM (NEEDLE) ×1 IMPLANT
NEEDLE INSUFFLATION 14GA 120MM (NEEDLE) ×1 IMPLANT
NS IRRIG 1000ML POUR BTL (IV SOLUTION) ×1 IMPLANT
PAD ARMBOARD 7.5X6 YLW CONV (MISCELLANEOUS) ×1 IMPLANT
SCISSORS LAP 5X35 DISP (ENDOMECHANICALS) ×1 IMPLANT
SET TUBE SMOKE EVAC HIGH FLOW (TUBING) ×1 IMPLANT
SLEEVE Z-THREAD 5X100MM (TROCAR) ×2 IMPLANT
SPECIMEN JAR SMALL (MISCELLANEOUS) ×1 IMPLANT
SUT MNCRL AB 4-0 PS2 18 (SUTURE) ×1 IMPLANT
SYS BAG RETRIEVAL 10MM (BASKET) ×1
SYSTEM BAG RETRIEVAL 10MM (BASKET) ×1 IMPLANT
TOWEL GREEN STERILE (TOWEL DISPOSABLE) IMPLANT
TOWEL GREEN STERILE FF (TOWEL DISPOSABLE) ×1 IMPLANT
TRAY LAPAROSCOPIC MC (CUSTOM PROCEDURE TRAY) ×1 IMPLANT
TROCAR 11X100 Z THREAD (TROCAR) ×1 IMPLANT
TROCAR Z-THREAD OPTICAL 5X100M (TROCAR) ×1 IMPLANT
WARMER LAPAROSCOPE (MISCELLANEOUS) ×1 IMPLANT
WATER STERILE IRR 1000ML POUR (IV SOLUTION) ×1 IMPLANT

## 2022-12-09 NOTE — Anesthesia Procedure Notes (Signed)
Procedure Name: Intubation Date/Time: 12/09/2022 12:04 PM  Performed by: Jairo Ben, MDPre-anesthesia Checklist: Patient identified, Emergency Drugs available, Suction available and Patient being monitored Patient Re-evaluated:Patient Re-evaluated prior to induction Oxygen Delivery Method: Circle system utilized Preoxygenation: Pre-oxygenation with 100% oxygen Induction Type: IV induction Ventilation: Mask ventilation without difficulty Laryngoscope Size: Mac and 3 Grade View: Grade II Tube type: Oral Tube size: 7.5 mm Number of attempts: 1 Airway Equipment and Method: Stylet and Oral airway Placement Confirmation: ETT inserted through vocal cords under direct vision, positive ETCO2 and breath sounds checked- equal and bilateral Secured at: 21 cm Tube secured with: Tape Dental Injury: Teeth and Oropharynx as per pre-operative assessment

## 2022-12-09 NOTE — Anesthesia Preprocedure Evaluation (Addendum)
Anesthesia Evaluation  Patient identified by MRN, date of birth, ID band Patient awake    Reviewed: Allergy & Precautions, NPO status , Patient's Chart, lab work & pertinent test results  History of Anesthesia Complications Negative for: history of anesthetic complications  Airway Mallampati: II  TM Distance: >3 FB Neck ROM: Full    Dental  (+) Partial Lower, Partial Upper, Dental Advisory Given   Pulmonary Current Smoker   breath sounds clear to auscultation       Cardiovascular hypertension,  Rhythm:Regular Rate:Normal     Neuro/Psych negative neurological ROS     GI/Hepatic ,GERD  Medicated and Controlled,,Elevated LFTs Acute cholecystitis   Endo/Other  diabetes (glu 138), Oral Hypoglycemic Agents    Renal/GU negative Renal ROS     Musculoskeletal   Abdominal   Peds  Hematology   Anesthesia Other Findings   Reproductive/Obstetrics                             Anesthesia Physical Anesthesia Plan  ASA: 2  Anesthesia Plan: General   Post-op Pain Management: Toradol IV (intra-op)*   Induction: Intravenous and Rapid sequence  PONV Risk Score and Plan: 2 and Ondansetron and Dexamethasone  Airway Management Planned: Oral ETT  Additional Equipment: None  Intra-op Plan:   Post-operative Plan: Extubation in OR  Informed Consent: I have reviewed the patients History and Physical, chart, labs and discussed the procedure including the risks, benefits and alternatives for the proposed anesthesia with the patient or authorized representative who has indicated his/her understanding and acceptance.     Dental advisory given and Interpreter used for interveiw  Plan Discussed with: CRNA and Surgeon  Anesthesia Plan Comments:         Anesthesia Quick Evaluation

## 2022-12-09 NOTE — Anesthesia Postprocedure Evaluation (Signed)
Anesthesia Post Note  Patient: Kathy Lowe  Procedure(s) Performed: LAPAROSCOPIC CHOLECYSTECTOMY     Patient location during evaluation: PACU Anesthesia Type: General Level of consciousness: patient cooperative, oriented and sedated Pain management: pain level controlled Vital Signs Assessment: post-procedure vital signs reviewed and stable Respiratory status: spontaneous breathing, nonlabored ventilation, respiratory function stable and patient connected to nasal cannula oxygen Cardiovascular status: blood pressure returned to baseline and stable Postop Assessment: no apparent nausea or vomiting Anesthetic complications: no   No notable events documented.  Last Vitals:  Vitals:   12/09/22 1345 12/09/22 1400  BP: (!) 153/85 (!) 157/89  Pulse: (!) 104 (!) 106  Resp: 14 12  Temp:    SpO2: 96% 94%    Last Pain:  Vitals:   12/09/22 1400  TempSrc:   PainSc: Asleep                 [name],E. Cynai Skeens

## 2022-12-09 NOTE — Transfer of Care (Signed)
Immediate Anesthesia Transfer of Care Note  Patient: Kathy Lowe  Procedure(s) Performed: LAPAROSCOPIC CHOLECYSTECTOMY  Patient Location: PACU  Anesthesia Type:General  Level of Consciousness: drowsy and patient cooperative  Airway & Oxygen Therapy: Patient Spontanous Breathing and Patient connected to nasal cannula oxygen  Post-op Assessment: Report given to RN, Post -op Vital signs reviewed and stable, and Patient moving all extremities X 4  Post vital signs: Reviewed and stable  Last Vitals:  Vitals Value Taken Time  BP 158/87 12/09/22 1300  Temp    Pulse 107 12/09/22 1300  Resp 13 12/09/22 1300  SpO2 91 % 12/09/22 1300  Vitals shown include unvalidated device data.  Last Pain:  Vitals:   12/09/22 1110  TempSrc: Oral  PainSc:          Complications: No notable events documented.

## 2022-12-09 NOTE — Interval H&P Note (Signed)
History and Physical Interval Note:  12/09/2022 11:23 AM  Kathy Lowe  has presented today for surgery, with the diagnosis of CHOLECYSTITIS.  The various methods of treatment have been discussed with the patient and family. After consideration of risks, benefits and other options for treatment, the patient has consented to  Procedure(s): LAPAROSCOPIC CHOLECYSTECTOMY (N/A) as a surgical intervention.  The patient's history has been reviewed, patient examined, no change in status, stable for surgery.  I have reviewed the patient's chart and labs.  Questions were answered to the patient's satisfaction.     [name]

## 2022-12-09 NOTE — Op Note (Signed)
Operative Note  MARLA DELGIUDICE 57 y.o. female [id number]  12/09/2022  Surgeon: Berna Bue MD FACS  Procedure performed: Laparoscopic Cholecystectomy  Procedure classification: urgent  Preop diagnosis: Acute cholecystitis Post-op diagnosis/intraop findings: same  Specimens: gallbladder  Retained items: none  EBL: minimal  Complications: none  Description of procedure: After obtaining informed consent the patient was brought to the operating room. Antibiotics were administered. SCD's were applied. General endotracheal anesthesia was initiated and a formal time-out was performed. The abdomen was prepped and draped in the usual sterile fashion and the abdomen was entered using an infraumbilical veress needle after instilling the site with local. Insufflation to was obtained, 5mm trocar and camera inserted, and gross inspection revealed no evidence of injury from our entry or other intraabdominal abnormalities. Two 5mm trocars were introduced in the right midclavicular and right anterior axillary lines under direct visualization and following infiltration with local. An 11mm trocar was placed in the epigastrium. The gallbladder fundus was retracted cephalad and the infundibulum was retracted laterally. A combination of hook electrocautery and blunt dissection was utilized to clear the peritoneum from the neck and cystic duct, circumferentially isolating the cystic artery and cystic duct and lifting the gallbladder from the cystic plate. The critical view of safety was achieved with the cystic artery, cystic duct, and liver bed visualized between them with no other structures. The artery was clipped with two clips proximally and one distally and divided as was the cystic duct with three clips on the proximal end. The gallbladder was dissected from the liver plate using electrocautery. Once freed the gallbladder was placed in an endocatch bag and removed through the epigastric trocar  site. A small amount of bleeding on the liver bed was controlled with cautery and Surgicel snow. Some bile had been spilled from the gallbladder during its dissection from the liver bed. This was aspirated and the right upper quadrant was irrigated with 1 L of warm sterile saline; the effluent was clear. Hemostasis was once again confirmed, and reinspection of the abdomen revealed no injuries. The clips were well opposed without any bile leak from the duct or the liver bed. The 11mm trocar site in the epigastrium was closed with a 0 vicryl in the fascia under direct visualization using a PMI device. The abdomen was desufflated and all trocars removed. The skin incisions were closed with running subcuticular monocryl and Dermabond. The patient was awakened, extubated and transported to the recovery room in stable condition.    All counts were correct at the completion of the case.

## 2022-12-10 ENCOUNTER — Encounter (HOSPITAL_COMMUNITY): Payer: Self-pay | Admitting: Surgery

## 2022-12-10 ENCOUNTER — Other Ambulatory Visit: Payer: Self-pay

## 2022-12-10 ENCOUNTER — Observation Stay (HOSPITAL_COMMUNITY): Payer: Medicaid Other

## 2022-12-10 DIAGNOSIS — Z833 Family history of diabetes mellitus: Secondary | ICD-10-CM | POA: Diagnosis not present

## 2022-12-10 DIAGNOSIS — Z79899 Other long term (current) drug therapy: Secondary | ICD-10-CM | POA: Diagnosis not present

## 2022-12-10 DIAGNOSIS — Z7984 Long term (current) use of oral hypoglycemic drugs: Secondary | ICD-10-CM | POA: Diagnosis not present

## 2022-12-10 DIAGNOSIS — E1169 Type 2 diabetes mellitus with other specified complication: Secondary | ICD-10-CM | POA: Diagnosis present

## 2022-12-10 DIAGNOSIS — K810 Acute cholecystitis: Secondary | ICD-10-CM | POA: Diagnosis present

## 2022-12-10 DIAGNOSIS — I10 Essential (primary) hypertension: Secondary | ICD-10-CM | POA: Diagnosis present

## 2022-12-10 DIAGNOSIS — E785 Hyperlipidemia, unspecified: Secondary | ICD-10-CM | POA: Diagnosis present

## 2022-12-10 DIAGNOSIS — K8012 Calculus of gallbladder with acute and chronic cholecystitis without obstruction: Secondary | ICD-10-CM | POA: Diagnosis present

## 2022-12-10 DIAGNOSIS — Z808 Family history of malignant neoplasm of other organs or systems: Secondary | ICD-10-CM | POA: Diagnosis not present

## 2022-12-10 DIAGNOSIS — K819 Cholecystitis, unspecified: Secondary | ICD-10-CM | POA: Diagnosis present

## 2022-12-10 DIAGNOSIS — F17210 Nicotine dependence, cigarettes, uncomplicated: Secondary | ICD-10-CM | POA: Diagnosis present

## 2022-12-10 DIAGNOSIS — Z791 Long term (current) use of non-steroidal anti-inflammatories (NSAID): Secondary | ICD-10-CM | POA: Diagnosis not present

## 2022-12-10 LAB — HEMOGLOBIN A1C
Hgb A1c MFr Bld: 7.7 % — ABNORMAL HIGH (ref 4.8–5.6)
Mean Plasma Glucose: 174 mg/dL

## 2022-12-10 LAB — CBC
HCT: 38 % (ref 36.0–46.0)
Hemoglobin: 12.5 g/dL (ref 12.0–15.0)
MCH: 30.9 pg (ref 26.0–34.0)
MCHC: 32.9 g/dL (ref 30.0–36.0)
MCV: 93.8 fL (ref 80.0–100.0)
Platelets: 226 10*3/uL (ref 150–400)
RBC: 4.05 MIL/uL (ref 3.87–5.11)
RDW: 13.2 % (ref 11.5–15.5)
WBC: 11.1 10*3/uL — ABNORMAL HIGH (ref 4.0–10.5)
nRBC: 0 % (ref 0.0–0.2)

## 2022-12-10 LAB — COMPREHENSIVE METABOLIC PANEL
ALT: 188 U/L — ABNORMAL HIGH (ref 0–44)
ALT: 168 U/L — ABNORMAL HIGH (ref 0–44)
AST: 250 U/L — ABNORMAL HIGH (ref 15–41)
AST: 163 U/L — ABNORMAL HIGH (ref 15–41)
Albumin: 2.9 g/dL — ABNORMAL LOW (ref 3.5–5.0)
Albumin: 2.7 g/dL — ABNORMAL LOW (ref 3.5–5.0)
Alkaline Phosphatase: 184 U/L — ABNORMAL HIGH (ref 38–126)
Alkaline Phosphatase: 174 U/L — ABNORMAL HIGH (ref 38–126)
Anion gap: 11 (ref 5–15)
Anion gap: 8 (ref 5–15)
BUN: 5 mg/dL — ABNORMAL LOW (ref 6–20)
BUN: <5 mg/dL — ABNORMAL LOW (ref 6–20)
CO2: 23 mmol/L (ref 22–32)
CO2: 24 mmol/L (ref 22–32)
Calcium: 8.5 mg/dL — ABNORMAL LOW (ref 8.9–10.3)
Calcium: 8.5 mg/dL — ABNORMAL LOW (ref 8.9–10.3)
Chloride: 101 mmol/L (ref 98–111)
Chloride: 106 mmol/L (ref 98–111)
Creatinine, Ser: 0.76 mg/dL (ref 0.44–1.00)
Creatinine, Ser: 0.71 mg/dL (ref 0.44–1.00)
GFR, Estimated: >60 mL/min (ref >60)
GFR, Estimated: >60 mL/min (ref >60)
Glucose, Bld: 209 mg/dL — ABNORMAL HIGH (ref 70–99)
Glucose, Bld: 220 mg/dL — ABNORMAL HIGH (ref 70–99)
Potassium: 3.4 mmol/L — ABNORMAL LOW (ref 3.5–5.1)
Potassium: 3.1 mmol/L — ABNORMAL LOW (ref 3.5–5.1)
Sodium: 135 mmol/L (ref 135–145)
Sodium: 138 mmol/L (ref 135–145)
Total Bilirubin: 2.9 mg/dL — ABNORMAL HIGH (ref 0.3–1.2)
Total Bilirubin: 3.3 mg/dL — ABNORMAL HIGH (ref 0.3–1.2)
Total Protein: 5.9 g/dL — ABNORMAL LOW (ref 6.5–8.1)
Total Protein: 5.8 g/dL — ABNORMAL LOW (ref 6.5–8.1)

## 2022-12-10 LAB — GLUCOSE, CAPILLARY
Glucose-Capillary: 151 mg/dL — ABNORMAL HIGH (ref 70–99)
Glucose-Capillary: 134 mg/dL — ABNORMAL HIGH (ref 70–99)
Glucose-Capillary: 120 mg/dL — ABNORMAL HIGH (ref 70–99)
Glucose-Capillary: 170 mg/dL — ABNORMAL HIGH (ref 70–99)
Glucose-Capillary: 193 mg/dL — ABNORMAL HIGH (ref 70–99)

## 2022-12-10 LAB — MAGNESIUM: Magnesium: 1.8 mg/dL (ref 1.7–2.4)

## 2022-12-10 MED ORDER — GADOBUTROL 1 MMOL/ML IV SOLN
7.0000 mL | Freq: Once | INTRAVENOUS | Status: AC | PRN
  Administered 2022-12-10: 7 mL via INTRAVENOUS

## 2022-12-10 NOTE — Discharge Instructions (Signed)
CIRUGIA LAPAROSCOPICA: INSTRUCCIONES DE POST OPERATORIO.  Revise siempre los documentos que le entreguen en el lugar donde se ha hecho la [place].  SI USTED NECESITA DOCUMENTOS DE INCAPACIDAD (DISABLE) O DE PERMISO FAMILAR (FAMILY LEAVE) NECESITA TRAERLOS A LA OFICINA PARA QUE SEAN PROCESADOS. NO  SE LOS DE A SU DOCTOR. A su alta del hospital se le dara una receta para Human resources officer. Tomela como ha sido recetada, si la necesita. Si no la necesita puede tomar, Acetaminofen (Tylenol) o Ibuprofen (Advil) para aliviar dolor moderado. Continue tomando el resto de sus medicinas. Si necesita rellenar la receta, llame a la farmacia. ellos contactan a nuestra oficina pidiendo autorizacion. Este tipo de receta no pueden ser [employer] de las  5pm o [employer] fines de [place]. Con relacion a la dieta: debe ser [employer] primeros dias despues que llege a la casa. Ejemplo: sopas y galleticas. Tome bastante liquido esos dias. La [employer] de los pacientes padecen de inflamacion y cambio de coloracion de la piel alrededor de las incisiones. esto toma dias en resolver.  pnerse una bolsa de hielo en el area affectada ayuda..  Es comun tambien tener un poco de estrenimiento si esta tomado medicinas para Chief Technology Officer. incremente la cantidad de liquidos a tomar y Engineer, production (Colace) esto previene el problema. Si ya tiene estrenimiento, es Designer, jewellery no ha defecado en 48 horas, puede tomar un laxativo (Milk of Magnesia or Miralax) uselo como el paquete le explica.  A menos que se le diga algo diferente. Remueva el bendaje a las 24-48 horas despues dela [place]. y puede banarse en la ducha sin ningun problema. usted puede tener steri-strips (pequenas curitas transparentes en la piel puesta encima de la incision)  Estas banditas strips should be left on the skin for 7-10 days.   Si su cirujano puso pegamento encima de la incision usted puede banarse bajo la ducha en 24 horas. Este pegamento empezara a caerse en las  proximas 2-3 semanas. Si le pusieron suturas o presillas (grapos) estos seran quitados en su proxima cita en la oficina. . ACTIVIDADES:  Puede hacer actividad ligera.  Como caminar , subir escaleras y poco a poco irlas incrementando tanto como las [place]. Puede tener relaciones sexuales cuando sea comfortable. No carge objetos pesados o haga esfuerzos que no sean aprovados por su doctor. Puede manejar en cuanto no esta tomando medicamentos fuertes (narcoticos) para Chief Technology Officer, pueda abrochar confortablemente el cinturon de seguridad, y pueda Personnel officer y usar los pedales de su vehiculo con seguridad. PUEDE REGRESAR A TRABAJAR  Debe ver a su doctor para una cita de seguimiento en 2-3 semanas despues de la [place].  OTRAS ISNSTRUCCIONES:___________________________________________________________________________________ Kathy Lowe A SU MEDICO: FIEBRE mayor de  101.0 No produccion de [place]. Sangramiento continue de la herida Incremento de Engineer, mining, enrojecimientio o drenaje de la herida (incision) Incremento de dolor abdominal.  The clinic staff is available to answer your questions during regular business hours.  Please don't hesitate to call and ask to speak to one of the nurses for clinical concerns.  If you have a medical emergency, go to the nearest emergency room or call 911.  A surgeon from [facility] is always on call at the hospital. [address], [place], [place]  [address] ? [address] [address], [place], [place]   [address] [phone] ? [phone] ? FAX [phone] Web site: [email or web address]   [facility]  Please arrive at least 30 min before your appointment to  complete your check in paperwork.  If you are unable to arrive 30 min prior to your appointment time we may have to cancel or reschedule you. LAPAROSCOPIC SURGERY: POST OP INSTRUCTIONS Always review your discharge instruction sheet given to you by the facility where  your surgery was performed. IF YOU HAVE DISABILITY OR FAMILY LEAVE FORMS, YOU MUST BRING THEM TO THE OFFICE FOR PROCESSING.   DO NOT GIVE THEM TO YOUR DOCTOR.  PAIN CONTROL  First take acetaminophen (Tylenol) AND/or ibuprofen (Advil) to control your pain after surgery.  Follow directions on package.  Taking acetaminophen (Tylenol) and/or ibuprofen (Advil) regularly after surgery will help to control your pain and lower the amount of prescription pain medication you may need.  You should not take more than 4,000 mg (4 grams) of acetaminophen (Tylenol) in 24 hours.  You should not take ibuprofen (Advil), aleve, motrin, naprosyn or other NSAIDS if you have a history of stomach ulcers or chronic kidney disease.  A prescription for pain medication may be given to you upon discharge.  Take your pain medication as prescribed, if you still have uncontrolled pain after taking acetaminophen (Tylenol) or ibuprofen (Advil). Use ice packs to help control pain. If you need a refill on your pain medication, please contact your pharmacy.  They will contact our office to request authorization. Prescriptions will not be filled after 5pm or on week-ends.  HOME MEDICATIONS Take your usually prescribed medications unless otherwise directed.  DIET You should follow a light diet the first few days after arrival home.  Be sure to include lots of fluids daily. Avoid fatty, fried foods.   CONSTIPATION It is common to experience some constipation after surgery and if you are taking pain medication.  Increasing fluid intake and taking a stool softener (such as Colace) will usually help or prevent this problem from occurring.  A mild laxative (Milk of Magnesia or Miralax) should be taken according to package instructions if there are no bowel movements after 48 hours.  WOUND/INCISION CARE Most patients will experience some swelling and bruising in the area of the incisions.  Ice packs will help.  Swelling and bruising can  take several days to resolve.  Unless discharge instructions indicate otherwise, follow guidelines below  STERI-STRIPS - you may remove your outer bandages 48 hours after surgery, and you may shower at that time.  You have steri-strips (small skin tapes) in place directly over the incision.  These strips should be left on the skin for 7-10 days.   DERMABOND/SKIN GLUE - you may shower in 24 hours.  The glue will flake off over the next 2-3 weeks. Any sutures or staples will be removed at the office during your follow-up visit.  ACTIVITIES You may resume regular (light) daily activities beginning the next day--such as daily self-care, walking, climbing stairs--gradually increasing activities as tolerated.  You may have sexual intercourse when it is comfortable.  Refrain from any heavy lifting or straining until approved by your doctor. You may drive when you are no longer taking prescription pain medication, you can comfortably wear a seatbelt, and you can safely maneuver your car and apply brakes.  FOLLOW-UP You should see your doctor in the office for a follow-up appointment approximately 2-3 weeks after your surgery.  You should have been given your post-op/follow-up appointment when your surgery was scheduled.  If you did not receive a post-op/follow-up appointment, make sure that you call for this appointment within a day or two after you arrive home  to insure a convenient appointment time.   WHEN TO CALL YOUR DOCTOR: Fever over 101.0 Inability to urinate Continued bleeding from incision. Increased pain, redness, or drainage from the incision. Increasing abdominal pain  The clinic staff is available to answer your questions during regular business hours.  Please don't hesitate to call and ask to speak to one of the nurses for clinical concerns.  If you have a medical emergency, go to the nearest emergency room or call 911.  A surgeon from [facility] is always on call at the  hospital. [address], [address], [place], [place]  [address] ? [address] [address], [place], [place]   [address] [phone] ? [phone] ? FAX [phone]

## 2022-12-10 NOTE — Plan of Care (Signed)

## 2022-12-10 NOTE — Progress Notes (Signed)
Raquel Robb Matar Interpreter used to check on pt needs, assisted RN with some explanations on medications, by Orlan Leavens Spanish Medical Interpreter.

## 2022-12-10 NOTE — Progress Notes (Signed)
Progress Note  1 Day Post-Op  Subjective: Patient's family member translated for her at patient request. Doing well overall, some upper abdominal pain but overall feeling better since surgery. Denies nausea or vomiting. Not passing flatus yet. Has not been up walking yet.   Objective: Vital signs in last 24 hours: Temp:  [97.6 F (36.4 C)-99.6 F (37.6 C)] 99.6 F (37.6 C) (06/03 0719) Pulse Rate:  [96-121] 99 (06/03 0719) Resp:  [11-19] 16 (06/03 0719) BP: (133-163)/(76-89) 147/77 (06/03 0719) SpO2:  [90 %-100 %] 90 % (06/03 0719) Last BM Date : 12/08/22  Intake/Output from previous day: 06/02 0701 - 06/03 0700 In: 4474.4 [I.V.:4374.4; IV Piggyback:100] Out: 20 [Blood:20] Intake/Output this shift: No intake/output data recorded.  PE: General: pleasant, WD, overweight female who is laying in bed in NAD HEENT: sclera anicteric  Heart: regular, rate, and rhythm.   Lungs:  Respiratory effort nonlabored Abd: soft, appropriately ttp, mild distention, incisions C/D/I Psych: A&Ox3 with an appropriate affect.    Lab Results:  Recent Labs    12/09/22 0051 12/10/22 0041  WBC 9.5 11.1*  HGB 13.1 12.5  HCT 38.9 38.0  PLT 240 226   BMET Recent Labs    12/09/22 0051 12/10/22 0041  NA 138 135  K 3.1* 3.4*  CL 105 101  CO2 23 23  GLUCOSE 141* 209*  BUN <5* 5*  CREATININE 0.63 0.76  CALCIUM 8.4* 8.5*   PT/INR No results for input(s): "LABPROT", "INR" in the last 72 hours. CMP     Component Value Date/Time   NA 135 12/10/2022 0041   K 3.4 (L) 12/10/2022 0041   CL 101 12/10/2022 0041   CO2 23 12/10/2022 0041   GLUCOSE 209 (H) 12/10/2022 0041   BUN 5 (L) 12/10/2022 0041   CREATININE 0.76 12/10/2022 0041   CREATININE 0.70 11/22/2015 1545   CALCIUM 8.5 (L) 12/10/2022 0041   PROT 5.9 (L) 12/10/2022 0041   ALBUMIN 2.9 (L) 12/10/2022 0041   AST 250 (H) 12/10/2022 0041   ALT 188 (H) 12/10/2022 0041   ALKPHOS 184 (H) 12/10/2022 0041   BILITOT 2.9 (H) 12/10/2022  0041   GFRNONAA >60 12/10/2022 0041   GFRNONAA >89 11/11/2014 0922   GFRAA >89 11/11/2014 0922   Lipase     Component Value Date/Time   LIPASE 28 12/08/2022 1039       Studies/Results: MR ABDOMEN WITH MRCP W CONTRAST  Result Date: 12/10/2022 CLINICAL DATA:  Elevated LFTs.  Evaluate for bile duct dilatation. EXAM: MRI ABDOMEN WITH CONTRAST (WITH MRCP) TECHNIQUE: Multiplanar multisequence MR imaging of the abdomen was performed following the administration of intravenous contrast. Heavily T2-weighted images of the biliary and pancreatic ducts were obtained, and three-dimensional MRCP images were rendered by post processing. CONTRAST:  7mL GADAVIST GADOBUTROL 1 MMOL/ML IV SOLN COMPARISON:  CT AP 12/08/2022 FINDINGS: Lower chest: New bilateral lower lobe consolidation/atelectasis. Trace pleural fluid noted bilaterally. Hepatobiliary: Hepatic steatosis. No suspicious enhancing liver lesions. Status post cholecystectomy. Postoperative changes identified within the gallbladder fossa with high T1, low T2 material measuring 6.6 x 2.1 by 1.4 cm. There is no common bile duct dilatation. No intrahepatic bile duct dilatation. The common bile duct measures up to 4 mm in diameter. No signs of choledocholithiasis. Pancreas: No mass, inflammatory changes, or other parenchymal abnormality identified. Spleen:  Within normal limits in size and appearance. Adrenals/Urinary Tract: Normal adrenal glands. No kidney mass or hydronephrosis identified. Stomach/Bowel: Moderate hiatal hernia. No dilated bowel loops within the abdomen or  pelvis. Vascular/Lymphatic: Normal appearance of the abdominal aorta. Upper abdominal vascularity appears patent. No adenopathy. Other: There is trace free fluid identified within the right upper quadrant of the abdomen, nonspecific in the early postoperative time frame. Musculoskeletal: No suspicious bone lesions identified. IMPRESSION: 1. Status post cholecystectomy. 2. There is high T1, low T2  material identified within the gallbladder fossa measuring 6.6 x 2.1 by 1.4 cm. Findings are favored to represent a postoperative change hematoma. 3. No signs of choledocholithiasis or biliary dilatation. 4. Hepatic steatosis. 5. Moderate hiatal hernia. 6. New bilateral lower lobe consolidation/atelectasis. Electronically Signed   By: Signa Kell M.D.   On: 12/10/2022 06:34   US Abdomen Limited RUQ (LIVER/GB)  Result Date: 12/08/2022 CLINICAL DATA:  Right upper quadrant pain EXAM: ULTRASOUND ABDOMEN LIMITED RIGHT UPPER QUADRANT COMPARISON:  CT scan of the abdomen pelvis December 08, 2022 FINDINGS: Gallbladder: A small amount of pericholecystic fluid is again identified. No gallbladder wall thickening. A rounded hyperechoic mildly shadowing region is identified in the gallbladder measuring 6 mm. This structure is non mobile throughout the study. No other evidence of stones. A positive Murphy's sign is reported. Common bile duct: Diameter: 3.5 mm Liver: Diffuse increased echogenicity. No focal mass. Portal vein is patent on color Doppler imaging with normal direction of blood flow towards the liver. Other: None. IMPRESSION: 1. A small amount of pericholecystic fluid is again identified. A positive Murphy's sign is reported. No gallbladder wall thickening. A 6 mm rounded hyperechoic mildly shadowing structure is identified in the gallbladder. This structure is non mobile throughout the study. This could represent a small stone adherent to the gallbladder wall or a polyp. If the clinical picture remains ambiguous and acute cholecystitis is a concern, recommend a HIDA scan. If the patient's gallbladder is not removed, recommend a 12 month follow-up ultrasound given the possibility of a 6 mm polyp. 2. Hepatic steatosis. Electronically Signed   By: Gerome Sam III M.D.   On: 12/08/2022 14:36   CT ABDOMEN PELVIS W CONTRAST  Result Date: 12/08/2022 CLINICAL DATA:  Epigastric pain EXAM: CT ABDOMEN AND PELVIS WITH  CONTRAST TECHNIQUE: Multidetector CT imaging of the abdomen and pelvis was performed using the standard protocol following bolus administration of intravenous contrast. RADIATION DOSE REDUCTION: This exam was performed according to the departmental dose-optimization program which includes automated exposure control, adjustment of the mA and/or kV according to patient size and/or use of iterative reconstruction technique. CONTRAST:  75mL OMNIPAQUE IOHEXOL 350 MG/ML SOLN COMPARISON:  Ultrasound 10/30/2022 FINDINGS: Lower chest: Left basilar scarring or atelectasis. Small hiatal hernia. Heart size is normal. Hepatobiliary: Diffusely decreased attenuation of the hepatic parenchyma. No focal liver lesion is identified. Small stone or polyp within the gallbladder fundus (series 6, image 21). Trace pericholecystic edema. No fat stranding or appreciable wall thickening. No biliary dilatation. Pancreas: Unremarkable. No pancreatic ductal dilatation or surrounding inflammatory changes. Spleen: Normal in size without focal abnormality. Adrenals/Urinary Tract: Unremarkable adrenal glands. 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 Kidneys enhance symmetrically without focal lesion, stone, or hydronephrosis. Ureters are nondilated. Urinary bladder appears unremarkable for the degree of distention. Stomach/Bowel: Small hiatal hernia. Stomach is otherwise within normal limits. Appendix appears normal (series 3, image 65). No evidence of bowel wall thickening, distention, or inflammatory changes. Vascular/Lymphatic: No significant vascular findings are present. There are a few mildly prominent mesenteric lymph nodes on the right, largest measuring 9 mm short axis (series 3, image 45). No pathologically enlarged abdominal or pelvic lymph nodes by  size criteria. Reproductive: Uterus and bilateral adnexa are unremarkable. Other: No free fluid. No abdominopelvic fluid collection. No pneumoperitoneum. No abdominal wall  hernia. Musculoskeletal: No acute or significant osseous findings. IMPRESSION: 1. Small stone or polyp within the gallbladder fundus with trace pericholecystic edema. No fat stranding or appreciable wall thickening. If there is concern for acute cholecystitis, right upper quadrant ultrasound is recommended. 2. Hepatic steatosis. 3. Small hiatal hernia. 4. Mildly prominent mesenteric lymph nodes on the right, which may be reactive. Electronically Signed   By: Duanne Guess D.O.   On: 12/08/2022 13:18    Anti-infectives: Anti-infectives (From admission, onward)    Start     Dose/Rate Route Frequency Ordered Stop   12/08/22 1645  cefTRIAXone (ROCEPHIN) 2 g in sodium chloride 0.9 % 100 mL IVPB        2 g 200 mL/hr over 30 Minutes Intravenous Every 24 hours 12/08/22 1644 12/15/22 1644        Assessment/Plan  Acute cholecystitis  POD1 s/p laparoscopic cholecystectomy  - Tbili elevated at 2.6 this AM, other LFTs mildly elevated but not unexpected post-op - MRCP this AM negative for biliary dilation or choledocholithiasis, post-op hematoma in gallbladder fossa - clinically patient doing well - repeat labs this afternoon and will check on patient this afternoon, may be able to discharge home if doing well  FEN: CM diet, SLIV VTE: LMWH ID: rocephin 6/1>>  LOS: 0 days     Juliet Rude, [facility] 12/10/2022, 10:26 AM Please see Amion for pager number during day hours 7:00am-4:30pm

## 2022-12-10 NOTE — Discharge Summary (Signed)
[facility] Discharge Summary   Patient ID: Kathy Lowe MRN: [id number] DOB/AGE: [birth year] 57 y.o.  Admit date: 12/08/2022 Discharge date: 12/11/2022  Admitting Diagnosis: Cholecystitis [K81.9] Symptomatic cholelithiasis [K80.20] Acute cholecystitis [K81.0]   Discharge Diagnosis Cholecystitis [K81.9] Symptomatic cholelithiasis [K80.20] Acute cholecystitis [K81.0] S/p laparoscopic cholecystectomy   Consultants None  Imaging: MR ABDOMEN WITH MRCP W CONTRAST  Result Date: 12/10/2022 CLINICAL DATA:  Elevated LFTs.  Evaluate for bile duct dilatation. EXAM: MRI ABDOMEN WITH CONTRAST (WITH MRCP) TECHNIQUE: Multiplanar multisequence MR imaging of the abdomen was performed following the administration of intravenous contrast. Heavily T2-weighted images of the biliary and pancreatic ducts were obtained, and three-dimensional MRCP images were rendered by post processing. CONTRAST:  7mL GADAVIST GADOBUTROL 1 MMOL/ML IV SOLN COMPARISON:  CT AP 12/08/2022 FINDINGS: Lower chest: New bilateral lower lobe consolidation/atelectasis. Trace pleural fluid noted bilaterally. Hepatobiliary: Hepatic steatosis. No suspicious enhancing liver lesions. Status post cholecystectomy. Postoperative changes identified within the gallbladder fossa with high T1, low T2 material measuring 6.6 x 2.1 by 1.4 cm. There is no common bile duct dilatation. No intrahepatic bile duct dilatation. The common bile duct measures up to 4 mm in diameter. No signs of choledocholithiasis. Pancreas: No mass, inflammatory changes, or other parenchymal abnormality identified. Spleen:  Within normal limits in size and appearance. Adrenals/Urinary Tract: Normal adrenal glands. No kidney mass or hydronephrosis identified. Stomach/Bowel: Moderate hiatal hernia. No dilated bowel loops within the abdomen or pelvis. Vascular/Lymphatic: Normal appearance of the abdominal aorta. Upper abdominal vascularity appears patent. No adenopathy.  Other: There is trace free fluid identified within the right upper quadrant of the abdomen, nonspecific in the early postoperative time frame. Musculoskeletal: No suspicious bone lesions identified. IMPRESSION: 1. Status post cholecystectomy. 2. There is high T1, low T2 material identified within the gallbladder fossa measuring 6.6 x 2.1 by 1.4 cm. Findings are favored to represent a postoperative change hematoma. 3. No signs of choledocholithiasis or biliary dilatation. 4. Hepatic steatosis. 5. Moderate hiatal hernia. 6. New bilateral lower lobe consolidation/atelectasis. Electronically Signed   By: Signa Kell M.D.   On: 12/10/2022 06:34    Procedures Dr. Fredricka Bonine (12/09/22) - Laparoscopic Cholecystectomy   Hospital Course:  57 y.o. female who presented to the ED with abdominal pain.  Workup showed acute on chronic cholecystitis.  Patient was admitted and underwent procedure listed above.  Tolerated procedure well and was transferred to the floor.  Diet was advanced as tolerated.  She did have elevation in total bilirubin and LFTs postoperatively however MRCP was negative for choledocholithiasis or biliary dilatation. On POD2, the patient was voiding well, tolerating diet, ambulating well, pain well controlled, vital signs stable, incisions c/d/i and felt stable for discharge home.  Patient will follow up in our office in 3 weeks and knows to call with questions or concerns. She will be sent home with instructions to use ibuprofen/tylenol to alternate with prescription oxycodone for pain control.  Physical Exam: General:  Alert, NAD, pleasant, comfortable Abd:  Soft, ND, mild tenderness, incisions C/D/I   I or a member of my team have reviewed this patient in the Controlled Substance Database.   Allergies as of 12/11/2022   No Known Allergies      Medication List     TAKE these medications    acetaminophen 500 MG tablet Commonly known as: TYLENOL Take 2 tablets (1,000 mg total) by mouth  every 6 (six) hours as needed for mild pain or moderate pain.   atorvastatin 20 MG tablet Commonly  known as: LIPITOR Take 20 mg by mouth daily.   docusate sodium 100 MG capsule Commonly known as: COLACE Take 1 capsule (100 mg total) by mouth 2 (two) times daily as needed for up to 7 days for mild constipation or moderate constipation.   glucose blood test strip Use as instructed   ibuprofen 200 MG tablet Commonly known as: ADVIL Take 400 mg by mouth daily. Patient took 2 tablets prior to coming to the Hospital today, 12/08/22.   metFORMIN 1000 MG tablet Commonly known as: GLUCOPHAGE Take 1 tablet (1,000 mg total) by mouth 2 (two) times daily with a meal.   omeprazole 40 MG capsule Commonly known as: PRILOSEC Take 1 capsule (40 mg total) by mouth daily. TAKE 1 CAPSULE(40 MG) BY MOUTH DAILY What changed: See the new instructions.   OneTouch UltraSoft 2 Lancets Misc 1 each by Does not apply route 2 (two) times daily as needed.   oxyCODONE 5 MG immediate release tablet Commonly known as: Oxy IR/ROXICODONE Take 1 tablet (5 mg total) by mouth every 6 (six) hours as needed for up to 3 days for severe pain.   polyethylene glycol 17 g packet Commonly known as: MIRALAX / GLYCOLAX Take 17 g by mouth daily as needed for mild constipation.          Follow-up Information     Maczis, Hedda Slade, [place]. Go on 01/01/2023.   Specialty: General Surgery Why: follow up on 6/25 at 8:30 am Please arrive 30 minutes early to complete check in, and bring photo ID and insurance card. Contact information: [address] [place] [address] [facility] [place] [address] [phone]                 Signed: Juliet Rude , [facility] 12/11/2022, 1:32 PM Please see Amion for pager number during day hours 7:00am-4:30pm

## 2022-12-11 ENCOUNTER — Other Ambulatory Visit: Payer: Self-pay | Admitting: *Deleted

## 2022-12-11 LAB — COMPREHENSIVE METABOLIC PANEL
ALT: 142 U/L — ABNORMAL HIGH (ref 0–44)
AST: 96 U/L — ABNORMAL HIGH (ref 15–41)
Albumin: 2.8 g/dL — ABNORMAL LOW (ref 3.5–5.0)
Alkaline Phosphatase: 176 U/L — ABNORMAL HIGH (ref 38–126)
Anion gap: 11 (ref 5–15)
BUN: <5 mg/dL — ABNORMAL LOW (ref 6–20)
CO2: 24 mmol/L (ref 22–32)
Calcium: 8.4 mg/dL — ABNORMAL LOW (ref 8.9–10.3)
Chloride: 104 mmol/L (ref 98–111)
Creatinine, Ser: 0.64 mg/dL (ref 0.44–1.00)
GFR, Estimated: >60 mL/min (ref >60)
Glucose, Bld: 114 mg/dL — ABNORMAL HIGH (ref 70–99)
Potassium: 3 mmol/L — ABNORMAL LOW (ref 3.5–5.1)
Sodium: 139 mmol/L (ref 135–145)
Total Bilirubin: 3.4 mg/dL — ABNORMAL HIGH (ref 0.3–1.2)
Total Protein: 5.9 g/dL — ABNORMAL LOW (ref 6.5–8.1)

## 2022-12-11 LAB — HEPATIC FUNCTION PANEL
ALT: 116 U/L — ABNORMAL HIGH (ref 0–44)
AST: 58 U/L — ABNORMAL HIGH (ref 15–41)
Albumin: 2.7 g/dL — ABNORMAL LOW (ref 3.5–5.0)
Alkaline Phosphatase: 195 U/L — ABNORMAL HIGH (ref 38–126)
Bilirubin, Direct: 1.7 mg/dL — ABNORMAL HIGH (ref 0.0–0.2)
Indirect Bilirubin: 1 mg/dL — ABNORMAL HIGH (ref 0.3–0.9)
Total Bilirubin: 2.7 mg/dL — ABNORMAL HIGH (ref 0.3–1.2)
Total Protein: 6 g/dL — ABNORMAL LOW (ref 6.5–8.1)

## 2022-12-11 LAB — GLUCOSE, CAPILLARY
Glucose-Capillary: 116 mg/dL — ABNORMAL HIGH (ref 70–99)
Glucose-Capillary: 118 mg/dL — ABNORMAL HIGH (ref 70–99)
Glucose-Capillary: 121 mg/dL — ABNORMAL HIGH (ref 70–99)
Glucose-Capillary: 182 mg/dL — ABNORMAL HIGH (ref 70–99)

## 2022-12-11 LAB — SURGICAL PATHOLOGY

## 2022-12-11 LAB — CBC
HCT: 36 % (ref 36.0–46.0)
Hemoglobin: 12.1 g/dL (ref 12.0–15.0)
MCH: 30.9 pg (ref 26.0–34.0)
MCHC: 33.6 g/dL (ref 30.0–36.0)
MCV: 92.1 fL (ref 80.0–100.0)
Platelets: 225 10*3/uL (ref 150–400)
RBC: 3.91 MIL/uL (ref 3.87–5.11)
RDW: 13.4 % (ref 11.5–15.5)
WBC: 8 10*3/uL (ref 4.0–10.5)
nRBC: 0 % (ref 0.0–0.2)

## 2022-12-11 LAB — MISC LABCORP TEST (SEND OUT): Labcorp test code: 83935

## 2022-12-11 MED ORDER — DOCUSATE SODIUM 100 MG PO CAPS: 100.0000 mg | ORAL_CAPSULE | Freq: Two times a day (BID) | ORAL | 0 refills | Status: AC | PRN

## 2022-12-11 MED ORDER — POLYETHYLENE GLYCOL 3350 17 G PO PACK: 17.0000 g | PACK | Freq: Every day | ORAL | 0 refills | Status: DC | PRN

## 2022-12-11 MED ORDER — ORAL CARE MOUTH RINSE: 15.0000 mL | OROMUCOSAL | Status: DC | PRN

## 2022-12-11 MED ORDER — POLYETHYLENE GLYCOL 3350 17 G PO PACK
17.0000 g | PACK | Freq: Every day | ORAL | Status: DC
  Administered 2022-12-11: 17 g via ORAL
  Filled 2022-12-11: qty 1

## 2022-12-11 MED ORDER — ACETAMINOPHEN 500 MG PO TABS: 1000.0000 mg | ORAL_TABLET | Freq: Four times a day (QID) | ORAL | Status: AC | PRN

## 2022-12-11 MED ORDER — OMEPRAZOLE 40 MG PO CPDR: 40.0000 mg | DELAYED_RELEASE_CAPSULE | Freq: Every day | ORAL | 1 refills | Status: DC

## 2022-12-11 MED ORDER — OXYCODONE HCL 5 MG PO TABS: 5.0000 mg | ORAL_TABLET | Freq: Four times a day (QID) | ORAL | 0 refills | Status: AC | PRN

## 2022-12-11 NOTE — Progress Notes (Signed)
Progress Note  2 Days Post-Op  Subjective: Patient's family member translated for her at patient request. Still having some epigastric abdominal pain that is intermittent. Passing flatus but no BM and she feels like maybe that is the source of pain. Sore right over incision as well which we discussed is expected. She is tolerating diet and denies nausea or vomiting. She has been walking.   Objective: Vital signs in last 24 hours: Temp:  [97.6 F (36.4 C)-99.9 F (37.7 C)] 98.7 F (37.1 C) (06/04 0829) Pulse Rate:  [92-105] 92 (06/04 0829) Resp:  [16-20] 16 (06/04 0829) BP: (129-155)/(66-75) 144/75 (06/04 0829) SpO2:  [91 %-95 %] 93 % (06/04 0829) Last BM Date : 12/08/22  Intake/Output from previous day: 06/03 0701 - 06/04 0700 In: 616.9 [P.O.:600; IV Piggyback:16.9] Out: -  Intake/Output this shift: No intake/output data recorded.  PE: General: pleasant, WD, overweight female who is laying in bed in NAD HEENT: sclera anicteric  Heart: regular, rate, and rhythm.   Lungs:  Respiratory effort nonlabored Abd: soft, appropriately ttp, mild distention, incisions C/D/I, +BS Psych: A&Ox3 with an appropriate affect.    Lab Results:  Recent Labs    12/10/22 0041 12/11/22 0133  WBC 11.1* 8.0  HGB 12.5 12.1  HCT 38.0 36.0  PLT 226 225    BMET Recent Labs    12/10/22 1300 12/11/22 0133  NA 138 139  K 3.1* 3.0*  CL 106 104  CO2 24 24  GLUCOSE 220* 114*  BUN <5* <5*  CREATININE 0.71 0.64  CALCIUM 8.5* 8.4*    PT/INR No results for input(s): "LABPROT", "INR" in the last 72 hours. CMP     Component Value Date/Time   NA 139 12/11/2022 0133   K 3.0 (L) 12/11/2022 0133   CL 104 12/11/2022 0133   CO2 24 12/11/2022 0133   GLUCOSE 114 (H) 12/11/2022 0133   BUN <5 (L) 12/11/2022 0133   CREATININE 0.64 12/11/2022 0133   CREATININE 0.70 11/22/2015 1545   CALCIUM 8.4 (L) 12/11/2022 0133   PROT 5.9 (L) 12/11/2022 0133   ALBUMIN 2.8 (L) 12/11/2022 0133   AST 96 (H)  12/11/2022 0133   ALT 142 (H) 12/11/2022 0133   ALKPHOS 176 (H) 12/11/2022 0133   BILITOT 3.4 (H) 12/11/2022 0133   GFRNONAA >60 12/11/2022 0133   GFRNONAA >89 11/11/2014 0922   GFRAA >89 11/11/2014 0922   Lipase     Component Value Date/Time   LIPASE 28 12/08/2022 1039       Studies/Results: MR ABDOMEN WITH MRCP W CONTRAST  Result Date: 12/10/2022 CLINICAL DATA:  Elevated LFTs.  Evaluate for bile duct dilatation. EXAM: MRI ABDOMEN WITH CONTRAST (WITH MRCP) TECHNIQUE: Multiplanar multisequence MR imaging of the abdomen was performed following the administration of intravenous contrast. Heavily T2-weighted images of the biliary and pancreatic ducts were obtained, and three-dimensional MRCP images were rendered by post processing. CONTRAST:  7mL GADAVIST GADOBUTROL 1 MMOL/ML IV SOLN COMPARISON:  CT AP 12/08/2022 FINDINGS: Lower chest: New bilateral lower lobe consolidation/atelectasis. Trace pleural fluid noted bilaterally. Hepatobiliary: Hepatic steatosis. No suspicious enhancing liver lesions. Status post cholecystectomy. Postoperative changes identified within the gallbladder fossa with high T1, low T2 material measuring 6.6 x 2.1 by 1.4 cm. There is no common bile duct dilatation. No intrahepatic bile duct dilatation. The common bile duct measures up to 4 mm in diameter. No signs of choledocholithiasis. Pancreas: No mass, inflammatory changes, or other parenchymal abnormality identified. Spleen:  Within normal limits in size  and appearance. Adrenals/Urinary Tract: Normal adrenal glands. No kidney mass or hydronephrosis identified. Stomach/Bowel: Moderate hiatal hernia. No dilated bowel loops within the abdomen or pelvis. Vascular/Lymphatic: Normal appearance of the abdominal aorta. Upper abdominal vascularity appears patent. No adenopathy. Other: There is trace free fluid identified within the right upper quadrant of the abdomen, nonspecific in the early postoperative time frame.  Musculoskeletal: No suspicious bone lesions identified. IMPRESSION: 1. Status post cholecystectomy. 2. There is high T1, low T2 material identified within the gallbladder fossa measuring 6.6 x 2.1 by 1.4 cm. Findings are favored to represent a postoperative change hematoma. 3. No signs of choledocholithiasis or biliary dilatation. 4. Hepatic steatosis. 5. Moderate hiatal hernia. 6. New bilateral lower lobe consolidation/atelectasis. Electronically Signed   By: Signa Kell M.D.   On: 12/10/2022 06:34    Anti-infectives: Anti-infectives (From admission, onward)    Start     Dose/Rate Route Frequency Ordered Stop   12/08/22 1645  cefTRIAXone (ROCEPHIN) 2 g in sodium chloride 0.9 % 100 mL IVPB        2 g 200 mL/hr over 30 Minutes Intravenous Every 24 hours 12/08/22 1644 12/15/22 1644        Assessment/Plan  Acute cholecystitis  POD2 s/p laparoscopic cholecystectomy  - Tbili 3.4 this AM from 3.3 yesterday afternoon - MRCP this AM negative for biliary dilation or choledocholithiasis, post-op hematoma in gallbladder fossa - ?resorption of hematoma vs transient post-op response for elevation in Tbili  - clinically patient doing well - repeat labs this afternoon and will check on patient this afternoon, may be able to discharge home if doing well  FEN: CM diet, SLIV VTE: LMWH ID: rocephin 6/1>>  LOS: 1 day     Juliet Rude, [facility] 12/11/2022, 8:52 AM Please see Amion for pager number during day hours 7:00am-4:30pm

## 2022-12-12 ENCOUNTER — Telehealth: Payer: Self-pay

## 2022-12-12 NOTE — Transitions of Care (Post Inpatient/ED Visit) (Signed)
12/12/2022  Name: Kathy Lowe MRN: [id number] DOB: March 15, 1966  Today's TOC FU Call Status: Today's TOC FU Call Status:: Successful TOC FU Call Competed TOC FU Call Complete Date: 12/12/22  Transition Care Management Follow-up Telephone Call Date of Discharge: 12/11/22 Discharge Facility: [facility]) Type of Discharge: Inpatient Admission Primary Inpatient Discharge Diagnosis:: Cholecystitis How have you been since you were released from the hospital?: Better Any questions or concerns?: No  Items Reviewed: Did you receive and understand the discharge instructions provided?: Yes Medications obtained,verified, and reconciled?: Yes (Medications Reviewed) Any new allergies since your discharge?: No Dietary orders reviewed?: Yes Do you have support at home?: Yes  Medications Reviewed Today: Medications Reviewed Today     Reviewed by Merleen Nicely, LPN (Licensed Practical Nurse) on 12/12/22 at 1015  Med List Status: <None>   Medication Order Taking? Sig Documenting Provider Last Dose Status Informant  acetaminophen (TYLENOL) 500 MG tablet [id number] Yes Take 2 tablets (1,000 mg total) by mouth every 6 (six) hours as needed for mild pain or moderate pain. Juliet Rude, PA-C Taking Active   atorvastatin (LIPITOR) 20 MG tablet [id number] Yes Take 20 mg by mouth daily. [provider] Taking Active Self, Child  docusate sodium (COLACE) 100 MG capsule [id number] Yes Take 1 capsule (100 mg total) by mouth 2 (two) times daily as needed for up to 7 days for mild constipation or moderate constipation. Juliet Rude, PA-C Taking Active   glucose blood test strip [id number] Yes Use as instructed Jeani Sow, MD Taking Active Self, Child  ibuprofen (ADVIL) 200 MG tablet [id number] Yes Take 400 mg by mouth daily. Patient took 2 tablets prior to coming to the Hospital today, 12/08/22. [provider] Taking Active Child, Self  metFORMIN (GLUCOPHAGE) 1000 MG tablet  [id number] Yes Take 1 tablet (1,000 mg total) by mouth 2 (two) times daily with a meal. Jeani Sow, MD Taking Active Self, Child  omeprazole (PRILOSEC) 40 MG capsule [id number] Yes Take 1 capsule (40 mg total) by mouth daily. TAKE 1 CAPSULE(40 MG) BY MOUTH DAILY Jeani Sow, MD Taking Active   OneTouch UltraSoft 2 Lancets MISC [id number] Yes 1 each by Does not apply route 2 (two) times daily as needed. Jeani Sow, MD Taking Active Self, Child  oxyCODONE (OXY IR/ROXICODONE) 5 MG immediate release tablet [id number] Yes Take 1 tablet (5 mg total) by mouth every 6 (six) hours as needed for up to 3 days for severe pain. Juliet Rude, PA-C Taking Active   polyethylene glycol (MIRALAX / GLYCOLAX) 17 g packet [id number] Yes Take 17 g by mouth daily as needed for mild constipation. Juliet Rude, PA-C Taking Active             Home Care and Equipment/Supplies: Were Home Health Services Ordered?: No Any new equipment or medical supplies ordered?: No  Functional Questionnaire: Do you need assistance with bathing/showering or dressing?: No Do you need assistance with meal preparation?: No Do you need assistance with eating?: No Do you have difficulty maintaining continence: No Do you need assistance with getting out of bed/getting out of a chair/moving?: Yes (family assists) Do you have difficulty managing or taking your medications?: No  Follow up appointments reviewed: PCP Follow-up appointment confirmed?: No (pt needs appt by 12-25-22 - no available appts- will ask front desk to call pt and schedule appt) Follow-up Provider: [facility] Follow-up appointment confirmed?: No Do you need transportation to your follow-up appointment?:  No Do you understand care options if your condition(s) worsen?: Yes-patient verbalized understanding    SIGNATURE  Woodfin Ganja LPN [facility] Nurse Health Advisor Direct Dial [phone]

## 2022-12-25 ENCOUNTER — Ambulatory Visit (INDEPENDENT_AMBULATORY_CARE_PROVIDER_SITE_OTHER): Payer: Medicaid Other | Admitting: Family Medicine

## 2022-12-25 ENCOUNTER — Encounter: Payer: Self-pay | Admitting: Family Medicine

## 2022-12-25 VITALS — BP 126/76 | HR 66 | Temp 98.0°F | Resp 16 | Ht 60.0 in | Wt 149.2 lb

## 2022-12-25 DIAGNOSIS — E1165 Type 2 diabetes mellitus with hyperglycemia: Secondary | ICD-10-CM | POA: Diagnosis not present

## 2022-12-25 DIAGNOSIS — R7989 Other specified abnormal findings of blood chemistry: Secondary | ICD-10-CM | POA: Diagnosis not present

## 2022-12-25 DIAGNOSIS — Z79899 Other long term (current) drug therapy: Secondary | ICD-10-CM | POA: Diagnosis not present

## 2022-12-25 DIAGNOSIS — Z7984 Long term (current) use of oral hypoglycemic drugs: Secondary | ICD-10-CM | POA: Diagnosis not present

## 2022-12-25 DIAGNOSIS — Z9049 Acquired absence of other specified parts of digestive tract: Secondary | ICD-10-CM | POA: Diagnosis not present

## 2022-12-25 NOTE — Progress Notes (Signed)
Subjective:     Patient ID: Kathy Lowe, female    DOB: 01-Feb-1966, 57 y.o.   MRN: [id number]  Chief Complaint  Patient presents with   Follow-up    Hospital follow-up, gallbladder removed on 12/09/22, having some pain around belly button     HPI-here w/interpreter Hosp follow up for emergent chole   admitted 12/08/22 to 6/4.  Post op, LFT and bili increased so had MRCP-normal.    Some pain RUQ and umbilicus now.  No D/C no fevers/chills.   Normal bowel movement.  No nausea and vomiting.    Patient can't read.  Children don't read spanish-needs things printed in English.  Wasn't aware of follow up at surgeon  Health Maintenance Due  Topic Date Due   OPHTHALMOLOGY EXAM  Never done   HIV Screening  Never done   DTaP/Tdap/Td (1 - Tdap) Never done   Colonoscopy  Never done   MAMMOGRAM  Never done   PAP SMEAR-Modifier  10/12/2017    Past Medical History:  Diagnosis Date   Anemia    Diabetes mellitus without complication (HCC)     Past Surgical History:  Procedure Laterality Date   CHOLECYSTECTOMY N/A 12/09/2022   Procedure: LAPAROSCOPIC CHOLECYSTECTOMY;  Surgeon: Berna Bue, MD;  Location: [facility] OR;  Service: General;  Laterality: N/A;   NO PAST SURGERIES       Current Outpatient Medications:    acetaminophen (TYLENOL) 500 MG tablet, Take 2 tablets (1,000 mg total) by mouth every 6 (six) hours as needed for mild pain or moderate pain., Disp: , Rfl:    atorvastatin (LIPITOR) 20 MG tablet, Take 20 mg by mouth daily., Disp: , Rfl:    glucose blood test strip, Use as instructed, Disp: 100 each, Rfl: 12   ibuprofen (ADVIL) 200 MG tablet, Take 400 mg by mouth daily. Patient took 2 tablets prior to coming to the Hospital today, 12/08/22., Disp: , Rfl:    metFORMIN (GLUCOPHAGE) 1000 MG tablet, Take 1 tablet (1,000 mg total) by mouth 2 (two) times daily with a meal., Disp: 180 tablet, Rfl: 3   omeprazole (PRILOSEC) 40 MG capsule, Take 1 capsule (40 mg total) by mouth daily. TAKE  1 CAPSULE(40 MG) BY MOUTH DAILY, Disp: 90 capsule, Rfl: 1   OneTouch UltraSoft 2 Lancets MISC, 1 each by Does not apply route 2 (two) times daily as needed., Disp: 100 each, Rfl: 1   polyethylene glycol (MIRALAX / GLYCOLAX) 17 g packet, Take 17 g by mouth daily as needed for mild constipation., Disp: 14 each, Rfl: 0  No Known Allergies ROS neg/noncontributory except as noted HPI/below      Objective:     BP 126/76   Pulse 66   Temp 98 F (36.7 C) (Temporal)   Resp 16   Ht 5' (1.524 m)   Wt 149 lb 4 oz (67.7 kg)   LMP 09/13/2014 Comment: has not had a period in 2 years, started on 09/13/14--   SpO2 95%   BMI 29.15 kg/m  Wt Readings from Last 3 Encounters:  12/25/22 149 lb 4 oz (67.7 kg)  12/08/22 150 lb 9.2 oz (68.3 kg)  09/25/22 150 lb 8 oz (68.3 kg)    Physical Exam   Gen: WDWN NAD HEENT: NCAT, conjunctiva not injected, sclera nonicteric CARDIAC: RRR, S1S2+, no murmur.  LUNGS: CTAB. No wheezes ABDOMEN:  BS+, soft, NTND, No HSM, no masses.  Healing port sites, no pus EXT:  no edema MSK: no gross abnormalities.  NEURO: A&O x3.  CN II-XII intact.  PSYCH: normal mood. Good eye contact     Assessment & Plan:  Status post laparoscopic cholecystectomy  Elevated liver function tests  High risk medication use -     CBC with Differential/Platelet -     Comprehensive metabolic panel  Type 2 diabetes mellitus with hyperglycemia, without long-term current use of insulin (HCC)   Status post lap chole-reviewed hosp notes.  Patient doing well.  Some post op pain still.advised of follow up w/surgeon and printed on AVS. Elevated LFT's post surgery-improved.  Check labs DM type 2-chronic.  Controlled.  Continue metformin 1000mg  twice daily High risk medications-reck labs   Return in about 3 months (around 03/27/2023) for DM.  Angelena Sole, MD

## 2022-12-25 NOTE — Patient Instructions (Signed)
Maczis, Puja Gosai, PA-C. Go on 01/01/2023.   Specialty: General Surgery Why: follow up on 6/25 at 8:30 am Please arrive 30 minutes early to complete check in, and bring photo ID and insurance card. Contact information: [address] [facility] [place] [address] [phone]

## 2022-12-26 ENCOUNTER — Other Ambulatory Visit: Payer: Self-pay | Admitting: *Deleted

## 2022-12-26 DIAGNOSIS — R7989 Other specified abnormal findings of blood chemistry: Secondary | ICD-10-CM

## 2022-12-26 LAB — COMPREHENSIVE METABOLIC PANEL
ALT: 32 U/L (ref 0–35)
AST: 27 U/L (ref 0–37)
Albumin: 4.3 g/dL (ref 3.5–5.2)
Alkaline Phosphatase: 144 U/L — ABNORMAL HIGH (ref 39–117)
BUN: 15 mg/dL (ref 6–23)
CO2: 29 mEq/L (ref 19–32)
Calcium: 9.8 mg/dL (ref 8.4–10.5)
Chloride: 101 mEq/L (ref 96–112)
Creatinine, Ser: 0.58 mg/dL (ref 0.40–1.20)
GFR: 100.19 mL/min (ref >60.00)
Glucose, Bld: 108 mg/dL — ABNORMAL HIGH (ref 70–99)
Potassium: 3.9 mEq/L (ref 3.5–5.1)
Sodium: 140 mEq/L (ref 135–145)
Total Bilirubin: 0.5 mg/dL (ref 0.2–1.2)
Total Protein: 7.7 g/dL (ref 6.0–8.3)

## 2022-12-26 LAB — CBC WITH DIFFERENTIAL/PLATELET
Basophils Absolute: 0.1 10*3/uL (ref 0.0–0.1)
Basophils Relative: 1.3 % (ref 0.0–3.0)
Eosinophils Absolute: 0.1 10*3/uL (ref 0.0–0.7)
Eosinophils Relative: 1.7 % (ref 0.0–5.0)
HCT: 42 % (ref 36.0–46.0)
Hemoglobin: 13.9 g/dL (ref 12.0–15.0)
Lymphocytes Relative: 39.4 % (ref 12.0–46.0)
Lymphs Abs: 2.7 10*3/uL (ref 0.7–4.0)
MCHC: 33.1 g/dL (ref 30.0–36.0)
MCV: 93.4 fl (ref 78.0–100.0)
Monocytes Absolute: 0.5 10*3/uL (ref 0.1–1.0)
Monocytes Relative: 7.5 % (ref 3.0–12.0)
Neutro Abs: 3.4 10*3/uL (ref 1.4–7.7)
Neutrophils Relative %: 50.1 % (ref 43.0–77.0)
Platelets: 601 10*3/uL — ABNORMAL HIGH (ref 150.0–400.0)
RBC: 4.5 Mil/uL (ref 3.87–5.11)
RDW: 12.9 % (ref 11.5–15.5)
WBC: 6.7 10*3/uL (ref 4.0–10.5)

## 2022-12-26 NOTE — Progress Notes (Signed)
Labs are better.  Platelets are very high-may be reaction to surgery.  Repeat cbcd 2-3 wks.

## 2023-01-18 ENCOUNTER — Other Ambulatory Visit (INDEPENDENT_AMBULATORY_CARE_PROVIDER_SITE_OTHER): Payer: Medicaid Other

## 2023-01-18 DIAGNOSIS — R7989 Other specified abnormal findings of blood chemistry: Secondary | ICD-10-CM

## 2023-01-18 LAB — CBC WITH DIFFERENTIAL/PLATELET
Basophils Absolute: 0 10*3/uL (ref 0.0–0.1)
Basophils Relative: 0.7 % (ref 0.0–3.0)
Eosinophils Absolute: 0.1 10*3/uL (ref 0.0–0.7)
Eosinophils Relative: 1.6 % (ref 0.0–5.0)
HCT: 40 % (ref 36.0–46.0)
Hemoglobin: 13.3 g/dL (ref 12.0–15.0)
Lymphocytes Relative: 37.8 % (ref 12.0–46.0)
Lymphs Abs: 2.4 10*3/uL (ref 0.7–4.0)
MCHC: 33.2 g/dL (ref 30.0–36.0)
MCV: 90.7 fl (ref 78.0–100.0)
Monocytes Absolute: 0.5 10*3/uL (ref 0.1–1.0)
Monocytes Relative: 7.6 % (ref 3.0–12.0)
Neutro Abs: 3.3 10*3/uL (ref 1.4–7.7)
Neutrophils Relative %: 52.3 % (ref 43.0–77.0)
Platelets: 320 10*3/uL (ref 150.0–400.0)
RBC: 4.42 Mil/uL (ref 3.87–5.11)
RDW: 13 % (ref 11.5–15.5)
WBC: 6.3 10*3/uL (ref 4.0–10.5)

## 2023-03-09 ENCOUNTER — Other Ambulatory Visit: Payer: Self-pay | Admitting: Family Medicine

## 2023-03-09 DIAGNOSIS — I10 Essential (primary) hypertension: Secondary | ICD-10-CM

## 2023-03-22 ENCOUNTER — Ambulatory Visit: Payer: Medicaid Other | Admitting: Family Medicine

## 2023-11-13 ENCOUNTER — Observation Stay (HOSPITAL_COMMUNITY)
Admission: EM | Admit: 2023-11-13 | Discharge: 2023-11-14 | Disposition: A | Discharge status: Home / Self Care | Attending: Internal Medicine | Admitting: Internal Medicine

## 2023-11-13 ENCOUNTER — Emergency Department (HOSPITAL_COMMUNITY)

## 2023-11-13 ENCOUNTER — Other Ambulatory Visit: Payer: Self-pay

## 2023-11-13 ENCOUNTER — Encounter (HOSPITAL_COMMUNITY): Payer: Self-pay | Admitting: Emergency Medicine

## 2023-11-13 DIAGNOSIS — I10 Essential (primary) hypertension: Secondary | ICD-10-CM | POA: Diagnosis present

## 2023-11-13 DIAGNOSIS — E876 Hypokalemia: Secondary | ICD-10-CM | POA: Diagnosis not present

## 2023-11-13 DIAGNOSIS — E1165 Type 2 diabetes mellitus with hyperglycemia: Secondary | ICD-10-CM | POA: Diagnosis not present

## 2023-11-13 DIAGNOSIS — N179 Acute kidney failure, unspecified: Secondary | ICD-10-CM | POA: Diagnosis not present

## 2023-11-13 DIAGNOSIS — R946 Abnormal results of thyroid function studies: Secondary | ICD-10-CM | POA: Diagnosis not present

## 2023-11-13 DIAGNOSIS — R7989 Other specified abnormal findings of blood chemistry: Secondary | ICD-10-CM | POA: Insufficient documentation

## 2023-11-13 DIAGNOSIS — I471 Supraventricular tachycardia, unspecified: Principal | ICD-10-CM | POA: Diagnosis present

## 2023-11-13 DIAGNOSIS — Z7984 Long term (current) use of oral hypoglycemic drugs: Secondary | ICD-10-CM | POA: Insufficient documentation

## 2023-11-13 DIAGNOSIS — R079 Chest pain, unspecified: Secondary | ICD-10-CM | POA: Diagnosis present

## 2023-11-13 DIAGNOSIS — F17210 Nicotine dependence, cigarettes, uncomplicated: Secondary | ICD-10-CM | POA: Diagnosis not present

## 2023-11-13 DIAGNOSIS — Z79899 Other long term (current) drug therapy: Secondary | ICD-10-CM | POA: Diagnosis not present

## 2023-11-13 DIAGNOSIS — E039 Hypothyroidism, unspecified: Secondary | ICD-10-CM

## 2023-11-13 LAB — BASIC METABOLIC PANEL WITH GFR
Anion gap: 18 — ABNORMAL HIGH (ref 5–15)
Anion gap: 11 (ref 5–15)
BUN: 7 mg/dL (ref 6–20)
BUN: 8 mg/dL (ref 6–20)
CO2: 19 mmol/L — ABNORMAL LOW (ref 22–32)
CO2: 23 mmol/L (ref 22–32)
Calcium: 9.4 mg/dL (ref 8.9–10.3)
Calcium: 8.6 mg/dL — ABNORMAL LOW (ref 8.9–10.3)
Chloride: 100 mmol/L (ref 98–111)
Chloride: 106 mmol/L (ref 98–111)
Creatinine, Ser: 1.16 mg/dL — ABNORMAL HIGH (ref 0.44–1.00)
Creatinine, Ser: 0.76 mg/dL (ref 0.44–1.00)
GFR, Estimated: 55 mL/min — ABNORMAL LOW (ref >60)
GFR, Estimated: >60 mL/min (ref >60)
Glucose, Bld: 389 mg/dL — ABNORMAL HIGH (ref 70–99)
Glucose, Bld: 166 mg/dL — ABNORMAL HIGH (ref 70–99)
Potassium: 3.3 mmol/L — ABNORMAL LOW (ref 3.5–5.1)
Potassium: 3.9 mmol/L (ref 3.5–5.1)
Sodium: 137 mmol/L (ref 135–145)
Sodium: 140 mmol/L (ref 135–145)

## 2023-11-13 LAB — URINALYSIS, ROUTINE W REFLEX MICROSCOPIC
Bilirubin Urine: NEGATIVE
Glucose, UA: >=500 mg/dL — AB
Hgb urine dipstick: NEGATIVE
Ketones, ur: NEGATIVE mg/dL
Leukocytes,Ua: NEGATIVE
Nitrite: NEGATIVE
Protein, ur: NEGATIVE mg/dL
Specific Gravity, Urine: 1.01 (ref 1.005–1.030)
pH: 7 (ref 5.0–8.0)

## 2023-11-13 LAB — CBC WITH DIFFERENTIAL/PLATELET
Abs Immature Granulocytes: 0 10*3/uL (ref 0.00–0.07)
Basophils Absolute: 0.3 10*3/uL — ABNORMAL HIGH (ref 0.0–0.1)
Basophils Relative: 2 %
Eosinophils Absolute: 0.4 10*3/uL (ref 0.0–0.5)
Eosinophils Relative: 3 %
HCT: 47.2 % — ABNORMAL HIGH (ref 36.0–46.0)
Hemoglobin: 15.6 g/dL — ABNORMAL HIGH (ref 12.0–15.0)
Lymphocytes Relative: 32 %
Lymphs Abs: 4 10*3/uL (ref 0.7–4.0)
MCH: 30.8 pg (ref 26.0–34.0)
MCHC: 33.1 g/dL (ref 30.0–36.0)
MCV: 93.3 fL (ref 80.0–100.0)
Monocytes Absolute: 0.3 10*3/uL (ref 0.1–1.0)
Monocytes Relative: 2 %
Neutro Abs: 7.6 10*3/uL (ref 1.7–7.7)
Neutrophils Relative %: 61 %
Platelets: 413 10*3/uL — ABNORMAL HIGH (ref 150–400)
RBC: 5.06 MIL/uL (ref 3.87–5.11)
RDW: 12.8 % (ref 11.5–15.5)
WBC: 12.5 10*3/uL — ABNORMAL HIGH (ref 4.0–10.5)
nRBC: 0 % (ref 0.0–0.2)
nRBC: 0 /100{WBCs}

## 2023-11-13 LAB — HEMOGLOBIN A1C
Hgb A1c MFr Bld: 8.6 % — ABNORMAL HIGH (ref 4.8–5.6)
Mean Plasma Glucose: 200.12 mg/dL

## 2023-11-13 LAB — GLUCOSE, CAPILLARY
Glucose-Capillary: 285 mg/dL — ABNORMAL HIGH (ref 70–99)
Glucose-Capillary: 118 mg/dL — ABNORMAL HIGH (ref 70–99)

## 2023-11-13 LAB — HIV ANTIBODY (ROUTINE TESTING W REFLEX): HIV Screen 4th Generation wRfx: NONREACTIVE

## 2023-11-13 LAB — CBC
HCT: 44.5 % (ref 36.0–46.0)
Hemoglobin: 15 g/dL (ref 12.0–15.0)
MCH: 31 pg (ref 26.0–34.0)
MCHC: 33.7 g/dL (ref 30.0–36.0)
MCV: 91.9 fL (ref 80.0–100.0)
Platelets: 331 10*3/uL (ref 150–400)
RBC: 4.84 MIL/uL (ref 3.87–5.11)
RDW: 12.8 % (ref 11.5–15.5)
WBC: 8.2 10*3/uL (ref 4.0–10.5)
nRBC: 0 % (ref 0.0–0.2)

## 2023-11-13 LAB — URINALYSIS, MICROSCOPIC (REFLEX)

## 2023-11-13 LAB — TSH
TSH: 11.63 u[IU]/mL — ABNORMAL HIGH (ref 0.350–4.500)
TSH: 3.236 u[IU]/mL (ref 0.350–4.500)

## 2023-11-13 LAB — T4, FREE: Free T4: 0.98 ng/dL (ref 0.61–1.12)

## 2023-11-13 LAB — TROPONIN I (HIGH SENSITIVITY)
Troponin I (High Sensitivity): 17 ng/L (ref <18)
Troponin I (High Sensitivity): 170 ng/L (ref <18)

## 2023-11-13 LAB — CBG MONITORING, ED
Glucose-Capillary: 200 mg/dL — ABNORMAL HIGH (ref 70–99)
Glucose-Capillary: 167 mg/dL — ABNORMAL HIGH (ref 70–99)

## 2023-11-13 LAB — BETA-HYDROXYBUTYRIC ACID: Beta-Hydroxybutyric Acid: 0.1 mmol/L (ref 0.05–0.27)

## 2023-11-13 LAB — MAGNESIUM: Magnesium: 2.1 mg/dL (ref 1.7–2.4)

## 2023-11-13 MED ORDER — METFORMIN HCL ER 500 MG PO TB24
500.0000 mg | ORAL_TABLET | Freq: Every day | ORAL | Status: DC
  Administered 2023-11-14: 500 mg via ORAL
  Filled 2023-11-13: qty 1

## 2023-11-13 MED ORDER — ENOXAPARIN SODIUM 40 MG/0.4ML IJ SOSY
40.0000 mg | PREFILLED_SYRINGE | INTRAMUSCULAR | Status: DC
  Administered 2023-11-13 – 2023-11-14 (×2): 40 mg via SUBCUTANEOUS
  Filled 2023-11-13: qty 0.4

## 2023-11-13 MED ORDER — SODIUM CHLORIDE 0.9 % IV SOLN: INTRAVENOUS | Status: AC

## 2023-11-13 MED ORDER — IOHEXOL 350 MG/ML SOLN
75.0000 mL | Freq: Once | INTRAVENOUS | Status: AC | PRN
  Administered 2023-11-13: 75 mL via INTRAVENOUS

## 2023-11-13 MED ORDER — ASPIRIN 81 MG PO CHEW
324.0000 mg | CHEWABLE_TABLET | Freq: Once | ORAL | Status: AC
  Administered 2023-11-13: 324 mg via ORAL
  Filled 2023-11-13: qty 4

## 2023-11-13 MED ORDER — ADENOSINE 6 MG/2ML IV SOLN
INTRAVENOUS | Status: AC
  Filled 2023-11-13: qty 4

## 2023-11-13 MED ORDER — POTASSIUM CHLORIDE CRYS ER 20 MEQ PO TBCR
40.0000 meq | EXTENDED_RELEASE_TABLET | Freq: Once | ORAL | Status: AC
Start: 2023-11-13 — End: 2023-11-13
  Administered 2023-11-13: 40 meq via ORAL
  Filled 2023-11-13: qty 2

## 2023-11-13 MED ORDER — INSULIN ASPART 100 UNIT/ML IJ SOLN
0.0000 [IU] | Freq: Three times a day (TID) | INTRAMUSCULAR | Status: DC
  Administered 2023-11-13: 3 [IU] via SUBCUTANEOUS
  Administered 2023-11-13: 8 [IU] via SUBCUTANEOUS
  Administered 2023-11-14: 3 [IU] via SUBCUTANEOUS
  Administered 2023-11-14: 2 [IU] via SUBCUTANEOUS

## 2023-11-13 MED ORDER — DILTIAZEM HCL ER COATED BEADS 120 MG PO CP24
120.0000 mg | ORAL_CAPSULE | Freq: Every day | ORAL | Status: DC
  Administered 2023-11-13 – 2023-11-14 (×2): 120 mg via ORAL
  Filled 2023-11-13 (×2): qty 1

## 2023-11-13 MED ORDER — ADENOSINE 6 MG/2ML IV SOLN
INTRAVENOUS | Status: AC
  Filled 2023-11-13: qty 2

## 2023-11-13 NOTE — ED Notes (Addendum)
 12 mg adenosine IVP at this time in LAC per EDP verbal order.

## 2023-11-13 NOTE — H&P (Addendum)
 History and Physical    Patient: Kathy Lowe YQM:[id number] DOB: October 16, 1965 DOA: 11/13/2023 DOS: the patient was seen and examined on 11/13/2023 PCP: Christel Cousins, MD  Patient coming from: Home  Chief Complaint:  Chief Complaint  Patient presents with   Chest Pain   HPI: Kathy Lowe is a 58 y.o. female with medical history significant of T2DM and s/p CCY in 2024 p/w headache and chest pain iso SVT c/b AKI.  Pt is a poor historian and native spanish speaker, from what I can gather from her and the interpreter, pt presented with cp and headache that began around 0200 on 5/7. Pt denies jaw claudication and n/v. Pt states that her chest pressure improved somewhat, but her headache persisted and that's what caused her to report to the ED. Of note, pt was previously being treated for diabetes and HTN and on metformin  and losartan  respectively, but has not taken any of this medication since her CCY in 12/2022 for unclear reasons.  In the ED, pt noted to be in SVT and received 12mg  of adenosine followed by 2L NS for HR 220s (now 100s) and is admitted to medicine for ongoing care.  Review of Systems: As mentioned in the history of present illness. All other systems reviewed and are negative. Past Medical History:  Diagnosis Date   Anemia    Diabetes mellitus without complication (HCC)    Past Surgical History:  Procedure Laterality Date   CHOLECYSTECTOMY N/A 12/09/2022   Procedure: LAPAROSCOPIC CHOLECYSTECTOMY;  Surgeon: Adalberto Acton, MD;  Location: [facility] OR;  Service: General;  Laterality: N/A;   NO PAST SURGERIES     Social History:  reports that she has been smoking cigarettes. She uses smokeless tobacco. She reports current alcohol use of about 7.0 standard drinks of alcohol per week. She reports that she does not use drugs.  No Known Allergies  Family History  Problem Relation Age of Onset   Cancer Sister        "bones".  another sister-uterin   Diabetes Sister      Prior to Admission medications   Medication Sig Start Date End Date Taking? Authorizing Provider  acetaminophen  (TYLENOL ) 500 MG tablet Take 2 tablets (1,000 mg total) by mouth every 6 (six) hours as needed for mild pain or moderate pain. 12/11/22  Yes Berkeley Breath R, PA-C  glucose blood test strip Use as instructed 06/14/22   Christel Cousins, MD  losartan  (COZAAR ) 100 MG tablet TAKE 1 TABLET(100 MG) BY MOUTH DAILY Patient not taking: Reported on 11/13/2023 03/09/23   Christel Cousins, MD  metFORMIN  (GLUCOPHAGE ) 1000 MG tablet Take 1 tablet (1,000 mg total) by mouth 2 (two) times daily with a meal. Patient not taking: Reported on 11/13/2023 09/26/22   Christel Cousins, MD  omeprazole  (PRILOSEC) 40 MG capsule Take 1 capsule (40 mg total) by mouth daily. TAKE 1 CAPSULE(40 MG) BY MOUTH DAILY Patient not taking: Reported on 11/13/2023 12/11/22   Christel Cousins, MD  OneTouch UltraSoft 2 Lancets MISC 1 each by Does not apply route 2 (two) times daily as needed. 06/14/22   Christel Cousins, MD  polyethylene glycol (MIRALAX  / GLYCOLAX ) 17 g packet Take 17 g by mouth daily as needed for mild constipation. Patient not taking: Reported on 11/13/2023 12/11/22   Annetta Killian, [place]    Physical Exam: Vitals:   11/13/23 0645 11/13/23 0752 11/13/23 0758 11/13/23 0900  BP: 132/82 (!) 143/88  129/74  Pulse: Aaron Aas)  103 (!) 104  92  Resp: 19 (!) 21  16  Temp:   (!) 97.4 F (36.3 C)   TempSrc:   Oral   SpO2: 99% 97%  97%   General: Alert, oriented x3, resting comfortably in no acute distress Respiratory: Lungs clear to auscultation bilaterally with normal respiratory effort; no w/r/r Cardiovascular: Regular rate and rhythm w/o m/r/g Abdomen: Soft, nontender, nondistended. Positive bowel sounds  Data Reviewed:  Lab Results  Component Value Date   WBC 12.5 (H) 11/13/2023   HGB 15.6 (H) 11/13/2023   HCT 47.2 (H) 11/13/2023   MCV 93.3 11/13/2023   PLT 413 (H) 11/13/2023   Lab Results  Component Value Date    GLUCOSE 389 (H) 11/13/2023   CALCIUM  9.4 11/13/2023   NA 137 11/13/2023   K 3.3 (L) 11/13/2023   CO2 19 (L) 11/13/2023   CL 100 11/13/2023   BUN 7 11/13/2023   CREATININE 1.16 (H) 11/13/2023   Lab Results  Component Value Date   ALT 32 12/25/2022   AST 27 12/25/2022   ALKPHOS 144 (H) 12/25/2022   BILITOT 0.5 12/25/2022   No results found for: "INR", "PROTIME"  Radiology: CT Angio Chest PE W and/or Wo Contrast Result Date: 11/13/2023 CLINICAL DATA:  Syncope/presyncope with cerebrovascular cause suspected EXAM: CT ANGIOGRAPHY CHEST WITH CONTRAST TECHNIQUE: Multidetector CT imaging of the chest was performed using the standard protocol during bolus administration of intravenous contrast. Multiplanar CT image reconstructions and MIPs were obtained to evaluate the vascular anatomy. RADIATION DOSE REDUCTION: This exam was performed according to the departmental dose-optimization program which includes automated exposure control, adjustment of the mA and/or kV according to patient size and/or use of iterative reconstruction technique. CONTRAST:  75mL OMNIPAQUE  IOHEXOL  350 MG/ML SOLN COMPARISON:  None Available. FINDINGS: Cardiovascular: Satisfactory opacification of the pulmonary arteries to the segmental level. No evidence of pulmonary embolism. Normal heart size. No pericardial effusion. Mediastinum/Nodes: Moderate sliding hiatal hernia. No mass or adenopathy. Lungs/Pleura: There is no edema, consolidation, effusion, or pneumothorax. Upper Abdomen: Cholecystectomy.  No acute or aggressive finding Musculoskeletal: No acute or aggressive finding Review of the MIP images confirms the above findings. IMPRESSION: Negative for pulmonary embolism or other acute finding. Moderate hiatal hernia. Electronically Signed   By: Ronnette Coke M.D.   On: 11/13/2023 05:47   DG Chest Portable 1 View Result Date: 11/13/2023 CLINICAL DATA:  Chest pain and headache EXAM: PORTABLE CHEST 1 VIEW COMPARISON:  06/08/2020  FINDINGS: Artifact from overlying cardiac leads The heart size and mediastinal contours are within normal limits. Both lungs are clear. The visualized skeletal structures are unremarkable. IMPRESSION: No active disease. Electronically Signed   By: Kimberley Penman M.D.   On: 11/13/2023 05:07     Assessment and Plan: 49F h/o T2DM and s/p CCY in 2024 p/w headache and chest pain iso SVT c/b AKI.  #AKI Baseline Cr 0.5. Admission Cr >1. Likely pre-renal -MIVF: NS at 150cc/h for 24h -Daily BMP -Strict I&Os and daily weights (standing preferred) -Renally dose medications for CrCl -Avoid lovenox , NSAIDs, morphine, Fleet's phosphate enema, regular insulin , contrast; no gadolinium for MRI to avoid nephrogenic systemic fibrosis -Consider renal US  and nephrology consult if worsening AKI or lack of improvement  #SVT #Elevated troponin Dehydration is most likely etiology Troponin 17-->170; presumably iso SVT/AKI -Cards consulted; recs: pending -F/u cardiac stress test  #Elevated TSH Could also be precipitating factor for SVT -F/u free T4 and total T3; consider endocrine consult if indicated  #Headache Presumably related  to dehydration vs h/o HTN -Consider resuming losartan  if BP elevated  #H/o T2DM -F/u A1c and resume metformin  if indicated   Advance Care Planning:   Code Status: Full Code   Consults: N/A  Family Communication: Son updated at bedside  Severity of Illness: The appropriate patient status for this patient is OBSERVATION. Observation status is judged to be reasonable and necessary in order to provide the required intensity of service to ensure the patient's safety. The patient's presenting symptoms, physical exam findings, and initial radiographic and laboratory data in the context of their medical condition is felt to place them at decreased risk for further clinical deterioration. Furthermore, it is anticipated that the patient will be medically stable for discharge from the  hospital within 2 midnights of admission.   ------- I spent 55 minutes reviewing previous labs/notes, obtaining separate history at the bedside, counseling/discussing the treatment plan outlined above, ordering medications/tests, and performing clinical documentation.  Author: Arne Langdon, MD 11/13/2023 11:03 AM  For on call review [email or web address].

## 2023-11-13 NOTE — ED Notes (Signed)
 2 L NS fluid bolus started per EDP verbal.

## 2023-11-13 NOTE — ED Triage Notes (Signed)
 Spanish Interpretor #[id number]  Patient c/o chest pain and headache.  Patient reports this started about 2 hours ago.  Patient reports history of same.  Patient denies cardiac history.

## 2023-11-13 NOTE — Consult Note (Addendum)
 Cardiology Consultation   Patient ID: Kathy Lowe MRN: [id number]; DOB: 06/24/1966  Admit date: 11/13/2023 Date of Consult: 11/13/2023  PCP:  Christel Cousins, MD   [facility] Providers Cardiologist:  None   {  Patient Profile:   Kathy Lowe is a 58 y.o. female with a hx of type 2 diabetes, cholecystectomy 2024, Spanish-speaking requires interpreter who is being seen 11/13/2023 for the evaluation of SVT/elevated troponins at the request of Dr. Sulema Endo.  History of Present Illness:  Daughter provided translation.  Kathy Lowe has no prior cardiac history.  Reports no family history of cardiac disease.  She smokes 2 to 3 cigarettes daily.  Drinks anywhere between 4-6 beers a week.  Denies any drug use.  Currently patient being evaluated for SVT and dehydration.  Patient reports 2 episodes that are likely related to her SVT.  Last week she had an episode where she suddenly became very dizzy and lightheaded with headache and mild complaints of chest pain.  Lasted about 15 minutes.  And then again today she had the same symptoms with complaints of chest pressure, palpitations, headache.  In the emergency room she was noted to be in SVT with heart rates around 220s and received 12 mg adenosine that broke her rhythm and now has been in sinus rhythm with complete resolution of symptoms.  Otherwise has no complaints of chest pain outside of these 2 episodes.  Denies any significant peripheral edema, orthopnea, dizziness, syncope, fatigue.  She reports she has very poor p.o. intake and often dehydrated.  She lives at home with her son and does not work currently.  CTA negative for PE.  Potassium 3.9.  Creatinine 1.16 but baseline is around 0.5-0.7.  Now normalized after IV fluids.  Troponins 17-170.  Hemoglobin 15.  TSH 11.6.  Past Medical History:  Diagnosis Date   Anemia    Diabetes mellitus without complication (HCC)     Past Surgical History:  Procedure Laterality Date    CHOLECYSTECTOMY N/A 12/09/2022   Procedure: LAPAROSCOPIC CHOLECYSTECTOMY;  Surgeon: Adalberto Acton, MD;  Location: [facility] OR;  Service: General;  Laterality: N/A;   NO PAST SURGERIES       Inpatient Medications: Scheduled Meds:  adenosine       adenosine       enoxaparin  (LOVENOX ) injection  40 mg Subcutaneous Q24H   insulin  aspart  0-15 Units Subcutaneous TID WC   [START ON 11/14/2023] metFORMIN   500 mg Oral Q breakfast   Continuous Infusions:  sodium chloride  150 mL/hr at 11/13/23 1252   PRN Meds: adenosine, adenosine  Allergies:   No Known Allergies  Social History:   Social History   Socioeconomic History   Marital status: Legally Separated    Spouse name: Not on file   Number of children: 6   Years of education: Not on file   Highest education level: Not on file  Occupational History   Not on file  Tobacco Use   Smoking status: Every Day    Current packs/day: 0.25    Types: Cigarettes   Smokeless tobacco: Current   Tobacco comments:    Last time 8 days ago  Vaping Use   Vaping status: Never Used  Substance and Sexual Activity   Alcohol use: Yes    Alcohol/week: 7.0 standard drinks of alcohol    Types: 7 Cans of beer per week    Comment: weekend   Drug use: No   Sexual activity: Yes  Other  Topics Concern   Not on file  Social History Narrative   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   Social Drivers of Health   Financial Resource Strain: Not on file  Food Insecurity: No Food Insecurity (12/09/2022)   Hunger Vital Sign    Worried About Running Out of Food in the Last Year: Never true    Ran Out of Food in the Last Year: Never true  Transportation Needs: No Transportation Needs (12/09/2022)   PRAPARE - Administrator, Civil Service (Medical): No    Lack of Transportation (Non-Medical): No  Physical Activity: Not on file  Stress: Not on file  Social Connections: Not on file  Intimate Partner Violence: Not At Risk (12/09/2022)   Humiliation, Afraid, Rape, and Kick  questionnaire    Fear of Current or Ex-Partner: No    Emotionally Abused: No    Physically Abused: No    Sexually Abused: No    Family History:   Family History  Problem Relation Age of Onset   Cancer Sister        "bones".  another sister-uterin   Diabetes Sister      ROS:  Please see the history of present illness.  All other ROS reviewed and negative.     Physical Exam/Data:   Vitals:   11/13/23 0752 11/13/23 0758 11/13/23 0900 11/13/23 1256  BP: (!) 143/88  129/74 (!) 146/83  Pulse: (!) 104  92 86  Resp: (!) 21  16 14   Temp:  (!) 97.4 F (36.3 C)  98.6 F (37 C)  TempSrc:  Oral  Oral  SpO2: 97%  97% 95%   No intake or output data in the 24 hours ending 11/13/23 1323    12/25/2022    3:17 PM 12/08/2022    4:19 PM 09/25/2022    8:26 AM  Last 3 Weights  Weight (lbs) 149 lb 4 oz 150 lb 9.2 oz 150 lb 8 oz  Weight (kg) 67.699 kg 68.3 kg 68.266 kg     There is no height or weight on file to calculate BMI.  General:  Well nourished, well developed, in no acute distress HEENT: normal Neck: no JVD Vascular: No carotid bruits; Distal pulses 2+ bilaterally Cardiac:  normal S1, S2; RRR; no murmur  Lungs:  clear to auscultation bilaterally, no wheezing, rhonchi or rales  Abd: soft, nontender, no hepatomegaly  Ext: no edema Musculoskeletal:  No deformities, BUE and BLE strength normal and equal Skin: warm and dry  Neuro:  CNs 2-12 intact, no focal abnormalities noted Psych:  Normal affect   EKG:  The EKG was personally reviewed and demonstrates: Initial EKG shows SVT with heart rate 227.  Second EKG shows sinus tachycardia heart rate 125.  No acute ST-T wave changes. Telemetry:  Telemetry was personally reviewed and demonstrates: Not on telemetry yet  Relevant CV Studies:   Laboratory Data:  High Sensitivity Troponin:   Recent Labs  Lab 11/13/23 0426 11/13/23 0650  TROPONINIHS 17 170*     Chemistry Recent Labs  Lab 11/13/23 0426 11/13/23 0856  NA 137 140   K 3.3* 3.9  CL 100 106  CO2 19* 23  GLUCOSE 389* 166*  BUN 7 8  CREATININE 1.16* 0.76  CALCIUM  9.4 8.6*  MG 2.1  --   GFRNONAA 55* >60  ANIONGAP 18* 11    No results for input(s): "PROT", "ALBUMIN", "AST", "ALT", "ALKPHOS", "BILITOT" in the last 168 hours. Lipids No results for input(s): "CHOL", "TRIG", "  HDL", "LABVLDL", "LDLCALC", "CHOLHDL" in the last 168 hours.  Hematology Recent Labs  Lab 11/13/23 0426 11/13/23 0856  WBC 12.5* 8.2  RBC 5.06 4.84  HGB 15.6* 15.0  HCT 47.2* 44.5  MCV 93.3 91.9  MCH 30.8 31.0  MCHC 33.1 33.7  RDW 12.8 12.8  PLT 413* 331   Thyroid  Recent Labs  Lab 11/13/23 0426  TSH 11.630*    BNPNo results for input(s): "BNP", "PROBNP" in the last 168 hours.  DDimer No results for input(s): "DDIMER" in the last 168 hours.   Radiology/Studies:  CT Angio Chest PE W and/or Wo Contrast Result Date: 11/13/2023 CLINICAL DATA:  Syncope/presyncope with cerebrovascular cause suspected EXAM: CT ANGIOGRAPHY CHEST WITH CONTRAST TECHNIQUE: Multidetector CT imaging of the chest was performed using the standard protocol during bolus administration of intravenous contrast. Multiplanar CT image reconstructions and MIPs were obtained to evaluate the vascular anatomy. RADIATION DOSE REDUCTION: This exam was performed according to the departmental dose-optimization program which includes automated exposure control, adjustment of the mA and/or kV according to patient size and/or use of iterative reconstruction technique. CONTRAST:  75mL OMNIPAQUE  IOHEXOL  350 MG/ML SOLN COMPARISON:  None Available. FINDINGS: Cardiovascular: Satisfactory opacification of the pulmonary arteries to the segmental level. No evidence of pulmonary embolism. Normal heart size. No pericardial effusion. Mediastinum/Nodes: Moderate sliding hiatal hernia. No mass or adenopathy. Lungs/Pleura: There is no edema, consolidation, effusion, or pneumothorax. Upper Abdomen: Cholecystectomy.  No acute or aggressive  finding Musculoskeletal: No acute or aggressive finding Review of the MIP images confirms the above findings. IMPRESSION: Negative for pulmonary embolism or other acute finding. Moderate hiatal hernia. Electronically Signed   By: Ronnette Coke M.D.   On: 11/13/2023 05:47   DG Chest Portable 1 View Result Date: 11/13/2023 CLINICAL DATA:  Chest pain and headache EXAM: PORTABLE CHEST 1 VIEW COMPARISON:  06/08/2020 FINDINGS: Artifact from overlying cardiac leads The heart size and mediastinal contours are within normal limits. Both lungs are clear. The visualized skeletal structures are unremarkable. IMPRESSION: No active disease. Electronically Signed   By: Kimberley Penman M.D.   On: 11/13/2023 05:07     Assessment and Plan:   SVT Sounds like she has had 2 episodes of this.  Today's episode had heart rates as high as 227 that broke with adenosine, now maintaining sinus rhythm. Episode likely exacerbated by a TSH of greater than 11 and dehydration. Will start diltiazem 120 mg daily, can also discharge her on 30 mg as needed dose for tachycardic spells. Do not feel this is atrial flutter as this broke with adenosine.  No need for heart monitor at this time. Should she have recurrence of episodes despite CCB/BB could consider ablation.  However would correct underlying issues dehydration and abnormal TSH. Will check echocardiogram though  Chest pain/elevated troponins Episode seems clearly related to tachycardia as when she converted she had complete resolution of symptoms otherwise does not have any chest pain normally.  Troponins are mildly elevated 17-170 that likely represent demand ischemia.  Subsequent EKG shows no acute ST-T wave changes. Will discontinue Lexiscan.  Should she have recurrence of chest pain in the absence of tachycardic spells then could consider some type of ischemic valuation with coronary CT outpatient   Risk Assessment/Risk Scores:    For questions or updates, please  contact [facility] Please consult [email or web address] for contact info under    Signed, Burnetta Cart, PA-C  11/13/2023 1:23 PM   I have personally seen and examined this patient.  I agree with the assessment and plan as outlined above.  58 yo female with DM admitted with chest pain and dyspnea in setting of SVT. SVT broken with adenosine.  Now with no pain in sinus rhythm Mild troponin elevation likely due to demand ischemia from rapid heart rate Labs reviewed by me EKG reviewed by me. No ischemic changes when in sinus My exam: NAD, CV:RRR Lungs: clear bilat  Ext: no LE edema Plan: SVT: Now in sinus. Start Cardizem. Echo. No ischemic evaluation necessary right now. We have cancelled the stress test. We can do outpatient ischemic testing if she has recurrent chest pain when she is not in SVT. (Of note, chest CT with no coronary calcium ).  We will see her tomorrow  Antoinette Batman, MD, [facility] 11/13/2023 2:07 PM

## 2023-11-13 NOTE — ED Notes (Signed)
 Patient transported to CT

## 2023-11-13 NOTE — ED Notes (Signed)
 2 L NS boluses have finished at this time.

## 2023-11-13 NOTE — Plan of Care (Signed)

## 2023-11-13 NOTE — ED Provider Notes (Signed)
 [facility] Provider Note   CSN: [id number] Arrival date & time: 11/13/23  0401     History  Chief Complaint  Patient presents with   Chest Pain    Kathy Lowe is a 58 y.o. female.  The history is provided by the patient and a relative. The history is limited by a language barrier.  Palpitations Palpitations quality:  Fast (regular) Onset quality:  Sudden Duration: 2 hours and 15 minutes. Progression:  Unchanged Chronicity:  Recurrent Context: nicotine   Context: not anxiety and not exercise   Context comment:  Chest pain and SVT Relieved by:  Nothing Worsened by:  Nothing Ineffective treatments:  None tried Associated symptoms: chest pain and dizziness   Associated symptoms: no vomiting and no weakness   Risk factors: no hx of PE   Patient with "pre-diabetes" with palpitations for 2 hours and 15 minutes.       Home Medications Prior to Admission medications   Medication Sig Start Date End Date Taking? Authorizing Provider  acetaminophen  (TYLENOL ) 500 MG tablet Take 2 tablets (1,000 mg total) by mouth every 6 (six) hours as needed for mild pain or moderate pain. 12/11/22   Annetta Killian, PA-C  atorvastatin  (LIPITOR) 20 MG tablet Take 20 mg by mouth daily.    [provider]  glucose blood test strip Use as instructed 06/14/22   Christel Cousins, MD  ibuprofen  (ADVIL ) 200 MG tablet Take 400 mg by mouth daily. Patient took 2 tablets prior to coming to the Hospital today, 12/08/22.    [provider]  losartan  (COZAAR ) 100 MG tablet TAKE 1 TABLET(100 MG) BY MOUTH DAILY 03/09/23   Christel Cousins, MD  metFORMIN  (GLUCOPHAGE ) 1000 MG tablet Take 1 tablet (1,000 mg total) by mouth 2 (two) times daily with a meal. 09/26/22   Christel Cousins, MD  omeprazole  (PRILOSEC) 40 MG capsule Take 1 capsule (40 mg total) by mouth daily. TAKE 1 CAPSULE(40 MG) BY MOUTH DAILY 12/11/22   Christel Cousins, MD  OneTouch UltraSoft 2  Lancets MISC 1 each by Does not apply route 2 (two) times daily as needed. 06/14/22   Christel Cousins, MD  polyethylene glycol (MIRALAX  / GLYCOLAX ) 17 g packet Take 17 g by mouth daily as needed for mild constipation. 12/11/22   Annetta Killian, PA-C      Allergies    Patient has no known allergies.    Review of Systems   Review of Systems  Cardiovascular:  Positive for chest pain and palpitations.  Gastrointestinal:  Negative for vomiting.  Neurological:  Positive for dizziness. Negative for weakness.    Physical Exam Updated Vital Signs BP 138/84   Pulse (!) 110   Temp 98 F (36.7 C)   Resp 14   LMP 09/13/2014 Comment: has not had a period in 2 years, started on 09/13/14--   SpO2 97%  Physical Exam Vitals and nursing note reviewed.  Constitutional:      General: She is not in acute distress.    Appearance: Normal appearance. She is well-developed.  HENT:     Head: Normocephalic and atraumatic.     Nose: Nose normal.  Eyes:     Pupils: Pupils are equal, round, and reactive to light.  Cardiovascular:     Rate and Rhythm: Regular rhythm.     Pulses: Normal pulses.     Heart sounds: Normal heart sounds.  Pulmonary:     Effort:  Pulmonary effort is normal. No respiratory distress.     Breath sounds: Normal breath sounds. No wheezing or rales.  Abdominal:     General: Bowel sounds are normal. There is no distension.     Palpations: Abdomen is soft.     Tenderness: There is no abdominal tenderness. There is no guarding or rebound.  Musculoskeletal:        General: Normal range of motion.     Cervical back: Neck supple.  Skin:    General: Skin is warm and dry.     Capillary Refill: Capillary refill takes less than 2 seconds.     Findings: No erythema or rash.  Neurological:     General: No focal deficit present.     Mental Status: She is alert and oriented to person, place, and time.     Deep Tendon Reflexes: Reflexes normal.  Psychiatric:        Mood and Affect: Mood  normal.     ED Results / Procedures / Treatments   Labs (all labs ordered are listed, but only abnormal results are displayed) Results for orders placed or performed during the hospital encounter of 11/13/23  TSH   Collection Time: 11/13/23  4:26 AM  Result Value Ref Range   TSH 11.630 (H) 0.350 - 4.500 uIU/mL  CBC with Differential   Collection Time: 11/13/23  4:26 AM  Result Value Ref Range   WBC 12.5 (H) 4.0 - 10.5 K/uL   RBC 5.06 3.87 - 5.11 MIL/uL   Hemoglobin 15.6 (H) 12.0 - 15.0 g/dL   HCT 91.4 (H) 78.2 - 95.6 %   MCV 93.3 80.0 - 100.0 fL   MCH 30.8 26.0 - 34.0 pg   MCHC 33.1 30.0 - 36.0 g/dL   RDW 21.3 08.6 - 57.8 %   Platelets 413 (H) 150 - 400 K/uL   nRBC 0.0 0.0 - 0.2 %   Neutrophils Relative % 61 %   Neutro Abs 7.6 1.7 - 7.7 K/uL   Lymphocytes Relative 32 %   Lymphs Abs 4.0 0.7 - 4.0 K/uL   Monocytes Relative 2 %   Monocytes Absolute 0.3 0.1 - 1.0 K/uL   Eosinophils Relative 3 %   Eosinophils Absolute 0.4 0.0 - 0.5 K/uL   Basophils Relative 2 %   Basophils Absolute 0.3 (H) 0.0 - 0.1 K/uL   WBC Morphology See Note    RBC Morphology See Note    Smear Review See Note    nRBC 0 0 /100 WBC   Abs Immature Granulocytes 0.00 0.00 - 0.07 K/uL  Basic metabolic panel   Collection Time: 11/13/23  4:26 AM  Result Value Ref Range   Sodium 137 135 - 145 mmol/L   Potassium 3.3 (L) 3.5 - 5.1 mmol/L   Chloride 100 98 - 111 mmol/L   CO2 19 (L) 22 - 32 mmol/L   Glucose, Bld 389 (H) 70 - 99 mg/dL   BUN 7 6 - 20 mg/dL   Creatinine, Ser 4.69 (H) 0.44 - 1.00 mg/dL   Calcium  9.4 8.9 - 10.3 mg/dL   GFR, Estimated 55 (L) >60 mL/min   Anion gap 18 (H) 5 - 15  Magnesium   Collection Time: 11/13/23  4:26 AM  Result Value Ref Range   Magnesium 2.1 1.7 - 2.4 mg/dL  Troponin I (High Sensitivity)   Collection Time: 11/13/23  4:26 AM  Result Value Ref Range   Troponin I (High Sensitivity) 17 <18 ng/L   CT Angio Chest  PE W and/or Wo Contrast Result Date: 11/13/2023 CLINICAL  DATA:  Syncope/presyncope with cerebrovascular cause suspected EXAM: CT ANGIOGRAPHY CHEST WITH CONTRAST TECHNIQUE: Multidetector CT imaging of the chest was performed using the standard protocol during bolus administration of intravenous contrast. Multiplanar CT image reconstructions and MIPs were obtained to evaluate the vascular anatomy. RADIATION DOSE REDUCTION: This exam was performed according to the departmental dose-optimization program which includes automated exposure control, adjustment of the mA and/or kV according to patient size and/or use of iterative reconstruction technique. CONTRAST:  75mL OMNIPAQUE  IOHEXOL  350 MG/ML SOLN COMPARISON:  None Available. FINDINGS: Cardiovascular: Satisfactory opacification of the pulmonary arteries to the segmental level. No evidence of pulmonary embolism. Normal heart size. No pericardial effusion. Mediastinum/Nodes: Moderate sliding hiatal hernia. No mass or adenopathy. Lungs/Pleura: There is no edema, consolidation, effusion, or pneumothorax. Upper Abdomen: Cholecystectomy.  No acute or aggressive finding Musculoskeletal: No acute or aggressive finding Review of the MIP images confirms the above findings. IMPRESSION: Negative for pulmonary embolism or other acute finding. Moderate hiatal hernia. Electronically Signed   By: Ronnette Coke M.D.   On: 11/13/2023 05:47   DG Chest Portable 1 View Result Date: 11/13/2023 CLINICAL DATA:  Chest pain and headache EXAM: PORTABLE CHEST 1 VIEW COMPARISON:  06/08/2020 FINDINGS: Artifact from overlying cardiac leads The heart size and mediastinal contours are within normal limits. Both lungs are clear. The visualized skeletal structures are unremarkable. IMPRESSION: No active disease. Electronically Signed   By: Kimberley Penman M.D.   On: 11/13/2023 05:07     EKG EKG Interpretation Date/Time:  Wednesday Nov 13 2023 04:22:54 EDT Ventricular Rate:  125 PR Interval:  140 QRS Duration:  98 QT Interval:  312 QTC  Calculation: 450 R Axis:   58  Text Interpretation: Sinus tachycardia Confirmed by Haven Pylant ([id number]) on 11/13/2023 5:23:07 AM  Radiology CT Angio Chest PE W and/or Wo Contrast Result Date: 11/13/2023 CLINICAL DATA:  Syncope/presyncope with cerebrovascular cause suspected EXAM: CT ANGIOGRAPHY CHEST WITH CONTRAST TECHNIQUE: Multidetector CT imaging of the chest was performed using the standard protocol during bolus administration of intravenous contrast. Multiplanar CT image reconstructions and MIPs were obtained to evaluate the vascular anatomy. RADIATION DOSE REDUCTION: This exam was performed according to the departmental dose-optimization program which includes automated exposure control, adjustment of the mA and/or kV according to patient size and/or use of iterative reconstruction technique. CONTRAST:  75mL OMNIPAQUE  IOHEXOL  350 MG/ML SOLN COMPARISON:  None Available. FINDINGS: Cardiovascular: Satisfactory opacification of the pulmonary arteries to the segmental level. No evidence of pulmonary embolism. Normal heart size. No pericardial effusion. Mediastinum/Nodes: Moderate sliding hiatal hernia. No mass or adenopathy. Lungs/Pleura: There is no edema, consolidation, effusion, or pneumothorax. Upper Abdomen: Cholecystectomy.  No acute or aggressive finding Musculoskeletal: No acute or aggressive finding Review of the MIP images confirms the above findings. IMPRESSION: Negative for pulmonary embolism or other acute finding. Moderate hiatal hernia. Electronically Signed   By: Ronnette Coke M.D.   On: 11/13/2023 05:47   DG Chest Portable 1 View Result Date: 11/13/2023 CLINICAL DATA:  Chest pain and headache EXAM: PORTABLE CHEST 1 VIEW COMPARISON:  06/08/2020 FINDINGS: Artifact from overlying cardiac leads The heart size and mediastinal contours are within normal limits. Both lungs are clear. The visualized skeletal structures are unremarkable. IMPRESSION: No active disease. Electronically Signed   By:  Kimberley Penman M.D.   On: 11/13/2023 05:07    Procedures .Cardioversion  Date/Time: 11/13/2023 7:05 AM  Performed by: Tonya Fredrickson, MD Authorized by: Maralee Senate,  Lamoine Fredricksen, MD   Consent:    Consent obtained:  Verbal   Consent given by:  Patient   Risks discussed:  Induced arrhythmia   Alternatives discussed:  No treatment Pre-procedure details:    Cardioversion basis:  Emergent   Rhythm:  Supraventricular tachycardia   Electrode placement:  Anterior-lateral Patient sedated: No Attempt one:    Cardioversion outcome attempt one: sinus tachycardia after 12 of adenosine. Post-procedure details:    Patient status:  Awake   Patient tolerance of procedure:  Tolerated well, no immediate complications .Critical Care  Performed by: Tonya Fredrickson, MD Authorized by: Tonya Fredrickson, MD   Critical care provider statement:    Critical care time (minutes):  30   Critical care end time:  11/13/2023 7:07 AM   Critical care was necessary to treat or prevent imminent or life-threatening deterioration of the following conditions:  Circulatory failure   Critical care was time spent personally by me on the following activities:  Development of treatment plan with patient or surrogate, discussions with consultants, evaluation of patient's response to treatment, examination of patient, ordering and review of laboratory studies, ordering and review of radiographic studies, ordering and performing treatments and interventions, pulse oximetry, re-evaluation of patient's condition and review of old charts   I assumed direction of critical care for this patient from another provider in my specialty: no     Care discussed with: admitting provider       Medications Ordered in ED Medications  adenosine (ADENOCARD) 6 MG/2ML injection (has no administration in time range)  adenosine (ADENOCARD) 6 MG/2ML injection (has no administration in time range)  aspirin chewable tablet 324 mg (has no administration in time  range)  potassium chloride SA (KLOR-CON M) CR tablet 40 mEq (has no administration in time range)  iohexol  (OMNIPAQUE ) 350 MG/ML injection 75 mL (75 mLs Intravenous Contrast Given 11/13/23 1610)    ED Course/ Medical Decision Making/ A&P                                 Medical Decision Making Patient with SVT for 2:15  Amount and/or Complexity of Data Reviewed Labs: ordered.    Details: Elevation of TSH 11.630 normal troponin 17, normal sodium 137, potassium slight low 3.3 elevated anion gap 18,  normal creatinine 1.16 elevated creatinine 1.16 Radiology: ordered and independent interpretation performed.    Details: No PE by me hiatal hernia by me  ECG/medicine tests: ordered and independent interpretation performed. Decision-making details documented in ED Course.  Risk OTC drugs. Prescription drug management. Decision regarding hospitalization. Risk Details: Patient with conversion from SVT 220 to sinus tachycardia.  IV fluids given   } Final Clinical Impression(s) / ED Diagnoses Final diagnoses:  Chest pain, unspecified type  SVT (supraventricular tachycardia) (HCC)  Hyperglycemia due to diabetes mellitus (HCC)  Hypokalemia   The patient appears reasonably stabilized for admission considering the current resources, flow, and capabilities available in the ED at this time, and I doubt any other [facility] requiring further screening and/or treatment in the ED prior to admission.  Rx / DC Orders ED Discharge Orders     None         Leilanni Halvorson, MD 11/13/23 [phone]

## 2023-11-14 ENCOUNTER — Inpatient Hospital Stay (HOSPITAL_COMMUNITY)

## 2023-11-14 DIAGNOSIS — I471 Supraventricular tachycardia, unspecified: Secondary | ICD-10-CM | POA: Diagnosis not present

## 2023-11-14 DIAGNOSIS — R079 Chest pain, unspecified: Secondary | ICD-10-CM

## 2023-11-14 DIAGNOSIS — N179 Acute kidney failure, unspecified: Secondary | ICD-10-CM | POA: Diagnosis not present

## 2023-11-14 DIAGNOSIS — R7989 Other specified abnormal findings of blood chemistry: Secondary | ICD-10-CM | POA: Diagnosis not present

## 2023-11-14 LAB — ECHOCARDIOGRAM COMPLETE
AR max vel: 2.41 cm2
AV Area VTI: 3.07 cm2
AV Area mean vel: 2.22 cm2
AV Mean grad: 2 mmHg
AV Peak grad: 5.2 mmHg
Ao pk vel: 1.14 m/s
Area-P 1/2: 3.58 cm2
Calc EF: 74.4 %
S' Lateral: 4.1 cm
Single Plane A2C EF: 66.6 %
Single Plane A4C EF: 79.3 %

## 2023-11-14 LAB — GLUCOSE, CAPILLARY
Glucose-Capillary: 165 mg/dL — ABNORMAL HIGH (ref 70–99)
Glucose-Capillary: 137 mg/dL — ABNORMAL HIGH (ref 70–99)

## 2023-11-14 MED ORDER — DILTIAZEM HCL ER COATED BEADS 120 MG PO CP24
120.0000 mg | ORAL_CAPSULE | Freq: Every day | ORAL | 2 refills | Status: AC
Start: 2023-11-15 — End: ?

## 2023-11-14 MED ORDER — ACETAMINOPHEN 500 MG PO TABS
1000.0000 mg | ORAL_TABLET | Freq: Once | ORAL | Status: AC | PRN
  Administered 2023-11-14: 1000 mg via ORAL
  Filled 2023-11-14: qty 2

## 2023-11-14 MED ORDER — PERFLUTREN LIPID MICROSPHERE
1.0000 mL | INTRAVENOUS | Status: AC | PRN
  Administered 2023-11-14: 2 mL via INTRAVENOUS

## 2023-11-14 NOTE — Progress Notes (Signed)
 Rounding Note    Patient Name: Kathy Lowe Date of Encounter: 11/14/2023  [facility] Cardiologist: None   Subjective   No chest pain or palpitations  Inpatient Medications    Scheduled Meds:  diltiazem  120 mg Oral Daily   enoxaparin  (LOVENOX ) injection  40 mg Subcutaneous Q24H   insulin  aspart  0-15 Units Subcutaneous TID WC   metFORMIN   500 mg Oral Q breakfast   Continuous Infusions:  sodium chloride  150 mL/hr at 11/14/23 0147   PRN Meds:    Vital Signs    Vitals:   11/13/23 1256 11/13/23 1550 11/13/23 1950 11/14/23 0436  BP: (!) 146/83 (!) 148/69 (!) 143/74 (!) 112/58  Pulse: 86 87 86 74  Resp: 14 17 18 18   Temp: 98.6 F (37 C) 98.4 F (36.9 C) 99.1 F (37.3 C) 97.7 F (36.5 C)  TempSrc: Oral Oral Oral Oral  SpO2: 95% 96% 95% 94%    Intake/Output Summary (Last 24 hours) at 11/14/2023 0723 Last data filed at 11/14/2023 0400 Gross per 24 hour  Intake 2000 ml  Output --  Net 2000 ml      12/25/2022    3:17 PM 12/08/2022    4:19 PM 09/25/2022    8:26 AM  Last 3 Weights  Weight (lbs) 149 lb 4 oz 150 lb 9.2 oz 150 lb 8 oz  Weight (kg) 67.699 kg 68.3 kg 68.266 kg      Telemetry    Sinus - Personally Reviewed  ECG    No am EKG - Personally Reviewed  Physical Exam   GEN: No acute distress.   Neck: No JVD Cardiac: RRR, no murmurs, rubs, or gallops.  Respiratory: Clear to auscultation bilaterally. GI: Soft, nontender, non-distended  MS: No edema; No deformity. Neuro:  Nonfocal  Psych: Normal affect   Labs    High Sensitivity Troponin:   Recent Labs  Lab 11/13/23 0426 11/13/23 0650  TROPONINIHS 17 170*     Chemistry Recent Labs  Lab 11/13/23 0426 11/13/23 0856  NA 137 140  K 3.3* 3.9  CL 100 106  CO2 19* 23  GLUCOSE 389* 166*  BUN 7 8  CREATININE 1.16* 0.76  CALCIUM  9.4 8.6*  MG 2.1  --   GFRNONAA 55* >60  ANIONGAP 18* 11    Lipids No results for input(s): "CHOL", "TRIG", "HDL", "LABVLDL", "LDLCALC", "CHOLHDL"  in the last 168 hours.  Hematology Recent Labs  Lab 11/13/23 0426 11/13/23 0856  WBC 12.5* 8.2  RBC 5.06 4.84  HGB 15.6* 15.0  HCT 47.2* 44.5  MCV 93.3 91.9  MCH 30.8 31.0  MCHC 33.1 33.7  RDW 12.8 12.8  PLT 413* 331   Thyroid  Recent Labs  Lab 11/13/23 1819  TSH 3.236  FREET4 0.98    BNPNo results for input(s): "BNP", "PROBNP" in the last 168 hours.  DDimer No results for input(s): "DDIMER" in the last 168 hours.   Radiology    CT Angio Chest PE W and/or Wo Contrast Result Date: 11/13/2023 CLINICAL DATA:  Syncope/presyncope with cerebrovascular cause suspected EXAM: CT ANGIOGRAPHY CHEST WITH CONTRAST TECHNIQUE: Multidetector CT imaging of the chest was performed using the standard protocol during bolus administration of intravenous contrast. Multiplanar CT image reconstructions and MIPs were obtained to evaluate the vascular anatomy. RADIATION DOSE REDUCTION: This exam was performed according to the departmental dose-optimization program which includes automated exposure control, adjustment of the mA and/or kV according to patient size and/or use of iterative reconstruction technique.  CONTRAST:  75mL OMNIPAQUE  IOHEXOL  350 MG/ML SOLN COMPARISON:  None Available. FINDINGS: Cardiovascular: Satisfactory opacification of the pulmonary arteries to the segmental level. No evidence of pulmonary embolism. Normal heart size. No pericardial effusion. Mediastinum/Nodes: Moderate sliding hiatal hernia. No mass or adenopathy. Lungs/Pleura: There is no edema, consolidation, effusion, or pneumothorax. Upper Abdomen: Cholecystectomy.  No acute or aggressive finding Musculoskeletal: No acute or aggressive finding Review of the MIP images confirms the above findings. IMPRESSION: Negative for pulmonary embolism or other acute finding. Moderate hiatal hernia. Electronically Signed   By: Ronnette Coke M.D.   On: 11/13/2023 05:47   DG Chest Portable 1 View Result Date: 11/13/2023 CLINICAL DATA:  Chest pain  and headache EXAM: PORTABLE CHEST 1 VIEW COMPARISON:  06/08/2020 FINDINGS: Artifact from overlying cardiac leads The heart size and mediastinal contours are within normal limits. Both lungs are clear. The visualized skeletal structures are unremarkable. IMPRESSION: No active disease. Electronically Signed   By: Kimberley Penman M.D.   On: 11/13/2023 05:07    Cardiac Studies    Patient Profile     58 y.o. female with history of DM admitted with chest pain/dyspnea and found to have SVT with heart rate over 200 bpm. Her chest pain resolved with restoration of sinus rhythm after IV adenosine. Mild elevation of troponin.   Assessment & Plan    Chest pain/Elevated troponin: Her troponin elevation is felt to be demand ischemia in the setting of SVT with heart rate over 200. No chest pain in sinus. Echo pending today.  Continue Cardizem given her SVT. If her echo is normal, can d/c home later today without ischemic workup. We can see her back in our office in several weeks.   For questions or updates, please contact [facility] Please consult [email or web address] for contact info under      Signed, Antoinette Batman, MD  11/14/2023, 7:23 AM

## 2023-11-14 NOTE — Discharge Summary (Signed)
 Physician Discharge Summary   Kathy Lowe WUJ:[id number] DOB: 1965-10-07 DOA: 11/13/2023  PCP: Christel Cousins, MD  Admit date: 11/13/2023 Discharge date: 11/14/2023  Admitted From: Home Disposition:  Home Discharging physician: Faith Homes, MD Barriers to discharge: none  Recommendations at discharge: Follow up with cardiology  Discharge Condition: stable CODE STATUS: Full  Diet recommendation:  Diet Orders (From admission, onward)     Start     Ordered   11/14/23 0000  Diet Carb Modified        11/14/23 1205   11/13/23 1639  Diet Carb Modified Fluid consistency: Thin; Room service appropriate? Yes  Diet effective now       Question Answer Comment  Diet-HS Snack? Nothing   Calorie Level Medium 1600-2000   Fluid consistency: Thin   Room service appropriate? Yes      11/13/23 1638            Hospital Course: Kathy Lowe is a 58 yo female with PMH DM II, HTN who presented with chest pain and headache.  She was found to have SVT with rates in the 220s.  She was treated with adenosine and fluids.  Cardiology was also consulted.  She responded well to Cardizem. Echo was also obtained and showed normal EF, 70-75%, no RWMA, grade 1 diastolic dysfunction. She was continued on Cardizem at discharge and losartan  was held.  Outpatient follow-up with cardiology planned.  Assessment and Plan: No notes have been filed under this hospital service. Service: Hospitalist      The patient's acute and chronic medical conditions were treated accordingly. On day of discharge, patient was felt deemed stable for discharge. Patient/family member advised to call PCP or come back to ER if needed.   Principal Diagnosis: SVT (supraventricular tachycardia) (HCC)  Discharge Diagnoses: Active Hospital Problems   Diagnosis Date Noted   Primary hypertension 03/30/2022   Type 2 diabetes mellitus with hyperglycemia, without long-term current use of insulin  (HCC) 03/30/2022    Resolved  Hospital Problems   Diagnosis Date Noted Date Resolved   SVT (supraventricular tachycardia) (HCC) 11/13/2023 11/14/2023    Priority: 1.   AKI (acute kidney injury) (HCC) 11/13/2023 11/14/2023     Discharge Instructions     Diet Carb Modified   Complete by: As directed    Increase activity slowly   Complete by: As directed       Allergies as of 11/14/2023   No Known Allergies      Medication List     STOP taking these medications    losartan  100 MG tablet Commonly known as: COZAAR    metFORMIN  1000 MG tablet Commonly known as: GLUCOPHAGE    omeprazole  40 MG capsule Commonly known as: PRILOSEC   polyethylene glycol 17 g packet Commonly known as: MIRALAX  / GLYCOLAX        TAKE these medications    acetaminophen  500 MG tablet Commonly known as: TYLENOL  Take 2 tablets (1,000 mg total) by mouth every 6 (six) hours as needed for mild pain or moderate pain.   diltiazem 120 MG 24 hr capsule Commonly known as: CARDIZEM CD Take 1 capsule (120 mg total) by mouth daily. Start taking on: Nov 15, 2023   glucose blood test strip Use as instructed   OneTouch UltraSoft 2 Lancets Misc 1 each by Does not apply route 2 (two) times daily as needed.        Follow-up Information     Christel Cousins, MD.   Specialty: Family Medicine Contact information:  [address] [place] [address] [phone]                No Known Allergies  Consultations: Cardiology  Procedures:   Discharge Exam: BP (!) 149/78 (BP Location: Left Arm)   Pulse 76   Temp 97.8 F (36.6 C) (Oral)   Resp 19   LMP 09/13/2014 Comment: has not had a period in 2 years, started on 09/13/14--   SpO2 94%  Physical Exam Constitutional:      Appearance: Normal appearance.  HENT:     Head: Normocephalic and atraumatic.     Mouth/Throat:     Mouth: Mucous membranes are moist.  Eyes:     Extraocular Movements: Extraocular movements intact.  Cardiovascular:     Rate and Rhythm:  Normal rate and regular rhythm.  Pulmonary:     Effort: Pulmonary effort is normal. No respiratory distress.     Breath sounds: Normal breath sounds. No wheezing.  Abdominal:     General: Bowel sounds are normal. There is no distension.     Palpations: Abdomen is soft.     Tenderness: There is no abdominal tenderness.  Musculoskeletal:        General: Normal range of motion.     Cervical back: Normal range of motion and neck supple.  Skin:    General: Skin is warm and dry.  Neurological:     General: No focal deficit present.     Mental Status: She is alert.  Psychiatric:        Mood and Affect: Mood normal.      The results of significant diagnostics from this hospitalization (including imaging, microbiology, ancillary and laboratory) are listed below for reference.   Microbiology: No results found for this or any previous visit (from the past 240 hours).   Labs: BNP (last 3 results) No results for input(s): "BNP" in the last 8760 hours. Basic Metabolic Panel: Recent Labs  Lab 11/13/23 0426 11/13/23 0856  NA 137 140  K 3.3* 3.9  CL 100 106  CO2 19* 23  GLUCOSE 389* 166*  BUN 7 8  CREATININE 1.16* 0.76  CALCIUM  9.4 8.6*  MG 2.1  --    Liver Function Tests: No results for input(s): "AST", "ALT", "ALKPHOS", "BILITOT", "PROT", "ALBUMIN" in the last 168 hours. No results for input(s): "LIPASE", "AMYLASE" in the last 168 hours. No results for input(s): "AMMONIA" in the last 168 hours. CBC: Recent Labs  Lab 11/13/23 0426 11/13/23 0856  WBC 12.5* 8.2  NEUTROABS 7.6  --   HGB 15.6* 15.0  HCT 47.2* 44.5  MCV 93.3 91.9  PLT 413* 331   Cardiac Enzymes: No results for input(s): "CKTOTAL", "CKMB", "CKMBINDEX", "TROPONINI" in the last 168 hours. BNP: Invalid input(s): "POCBNP" CBG: Recent Labs  Lab 11/13/23 1221 11/13/23 1740 11/13/23 1954 11/14/23 0747 11/14/23 1200  GLUCAP 167* 285* 118* 165* 137*   D-Dimer No results for input(s): "DDIMER" in the last 72  hours. Hgb A1c Recent Labs    11/13/23 1819  HGBA1C 8.6*   Lipid Profile No results for input(s): "CHOL", "HDL", "LDLCALC", "TRIG", "CHOLHDL", "LDLDIRECT" in the last 72 hours. Thyroid function studies Recent Labs    11/13/23 1819  TSH 3.236   Anemia work up No results for input(s): "VITAMINB12", "FOLATE", "FERRITIN", "TIBC", "IRON", "RETICCTPCT" in the last 72 hours. Urinalysis    Component Value Date/Time   COLORURINE STRAW (A) 11/13/2023 0640   APPEARANCEUR CLEAR 11/13/2023 0640   LABSPEC 1.010 11/13/2023 0640  PHURINE 7.0 11/13/2023 0640   GLUCOSEU >=500 (A) 11/13/2023 0640   HGBUR NEGATIVE 11/13/2023 0640   BILIRUBINUR NEGATIVE 11/13/2023 0640   KETONESUR NEGATIVE 11/13/2023 0640   PROTEINUR NEGATIVE 11/13/2023 0640   UROBILINOGEN 0.2 12/26/2015 1453   NITRITE NEGATIVE 11/13/2023 0640   LEUKOCYTESUR NEGATIVE 11/13/2023 0640   Sepsis Labs Recent Labs  Lab 11/13/23 0426 11/13/23 0856  WBC 12.5* 8.2   Microbiology No results found for this or any previous visit (from the past 240 hours).  Procedures/Studies: ECHOCARDIOGRAM COMPLETE Result Date: 11/14/2023    ECHOCARDIOGRAM REPORT   Patient Name:   Kathy Lowe Date of Exam: 11/14/2023 Medical Rec #:  [id number]        Height:       60.0 in Accession #:    [id number]       Weight:       149.2 lb Date of Birth:  February 02, 1966         BSA:          1.648 m Patient Age:    57 years         BP:           112/58 mmHg Patient Gender: F                HR:           71 bpm. Exam Location:  Inpatient Procedure: 2D Echo, Color Doppler and Cardiac Doppler (Both Spectral and Color            Flow Doppler were utilized during procedure). Indications:    R07.9* Chest pain, unspecified  History:        Patient has no prior history of Echocardiogram examinations.                 Risk Factors:Diabetes and Hypertension.  Sonographer:    Andrena Bang Referring Phys: [id number] SHENG L HALEY IMPRESSIONS  1. Left ventricular ejection fraction, by  estimation, is 70 to 75%. Left ventricular ejection fraction by 2D MOD biplane is 74.4 %. The left ventricle has hyperdynamic function. The left ventricle has no regional wall motion abnormalities. Left ventricular diastolic parameters are consistent with Grade I diastolic dysfunction (impaired relaxation).  2. Right ventricular systolic function is normal. The right ventricular size is normal. There is normal pulmonary artery systolic pressure.  3. The mitral valve is normal in structure. Trivial mitral valve regurgitation. No evidence of mitral stenosis.  4. The aortic valve is tricuspid. Aortic valve regurgitation is not visualized. No aortic stenosis is present.  5. The inferior vena cava is normal in size with greater than 50% respiratory variability, suggesting right atrial pressure of 3 mmHg. FINDINGS  Left Ventricle: Left ventricular ejection fraction, by estimation, is 70 to 75%. Left ventricular ejection fraction by 2D MOD biplane is 74.4 %. The left ventricle has hyperdynamic function. The left ventricle has no regional wall motion abnormalities. The left ventricular internal cavity size was normal in size. There is no left ventricular hypertrophy. Left ventricular diastolic parameters are consistent with Grade I diastolic dysfunction (impaired relaxation). Normal left ventricular filling pressure. Right Ventricle: The right ventricular size is normal. No increase in right ventricular wall thickness. Right ventricular systolic function is normal. There is normal pulmonary artery systolic pressure. The tricuspid regurgitant velocity is 2.05 m/s, and  with an assumed right atrial pressure of 3 mmHg, the estimated right ventricular systolic pressure is 19.8 mmHg. Left Atrium: Left atrial size was normal in size. Right  Atrium: Right atrial size was normal in size. Pericardium: There is no evidence of pericardial effusion. Mitral Valve: The mitral valve is normal in structure. Trivial mitral valve  regurgitation. No evidence of mitral valve stenosis. Tricuspid Valve: The tricuspid valve is normal in structure. Tricuspid valve regurgitation is not demonstrated. No evidence of tricuspid stenosis. Aortic Valve: The aortic valve is tricuspid. Aortic valve regurgitation is not visualized. No aortic stenosis is present. Aortic valve mean gradient measures 2.0 mmHg. Aortic valve peak gradient measures 5.2 mmHg. Aortic valve area, by VTI measures 3.07 cm. Pulmonic Valve: The pulmonic valve was normal in structure. Pulmonic valve regurgitation is trivial. No evidence of pulmonic stenosis. Aorta: The aortic root is normal in size and structure. Venous: The inferior vena cava is normal in size with greater than 50% respiratory variability, suggesting right atrial pressure of 3 mmHg. IAS/Shunts: No atrial level shunt detected by color flow Doppler.  LEFT VENTRICLE PLAX 2D                        Biplane EF (MOD) LVIDd:         4.50 cm         LV Biplane EF:   Left LVIDs:         4.10 cm                          ventricular LV PW:         1.10 cm                          ejection LV IVS:        0.60 cm                          fraction by LVOT diam:     2.20 cm                          2D MOD LV SV:         73                               biplane is LV SV Index:   44                               74.4 %. LVOT Area:     3.80 cm                                Diastology                                LV e' medial:    6.85 cm/s LV Volumes (MOD)               LV E/e' medial:  9.0 LV vol d, MOD    64.9 ml       LV e' lateral:   8.49 cm/s A2C:                           LV E/e' lateral: 7.3 LV vol d, MOD  107.0 ml A4C: LV vol s, MOD    21.7 ml A2C: LV vol s, MOD    22.1 ml A4C: LV SV MOD A2C:   43.2 ml LV SV MOD A4C:   107.0 ml LV SV MOD BP:    64.6 ml RIGHT VENTRICLE RV S prime:     10.80 cm/s TAPSE (M-mode): 1.6 cm LEFT ATRIUM             Index LA diam:        4.10 cm 2.49 cm/m LA Vol (A2C):   42.6 ml 25.85 ml/m LA Vol  (A4C):   38.7 ml 23.48 ml/m LA Biplane Vol: 41.8 ml 25.36 ml/m  AORTIC VALVE AV Area (Vmax):    2.41 cm AV Area (Vmean):   2.22 cm AV Area (VTI):     3.07 cm AV Vmax:           114.00 cm/s AV Vmean:          71.300 cm/s AV VTI:            0.238 m AV Peak Grad:      5.2 mmHg AV Mean Grad:      2.0 mmHg LVOT Vmax:         72.20 cm/s LVOT Vmean:        41.700 cm/s LVOT VTI:          0.192 m LVOT/AV VTI ratio: 0.81  AORTA Ao Asc diam: 2.80 cm MITRAL VALVE               TRICUSPID VALVE MV Area (PHT): 3.58 cm    TR Peak grad:   16.8 mmHg MV Decel Time: 212 msec    TR Vmax:        205.00 cm/s MV E velocity: 61.80 cm/s MV A velocity: 60.40 cm/s  SHUNTS MV E/A ratio:  1.02        Systemic VTI:  0.19 m                            Systemic Diam: 2.20 cm Maudine Sos MD Electronically signed by Maudine Sos MD Signature Date/Time: 11/14/2023/12:00:06 PM    Final    CT Angio Chest PE W and/or Wo Contrast Result Date: 11/13/2023 CLINICAL DATA:  Syncope/presyncope with cerebrovascular cause suspected EXAM: CT ANGIOGRAPHY CHEST WITH CONTRAST TECHNIQUE: Multidetector CT imaging of the chest was performed using the standard protocol during bolus administration of intravenous contrast. Multiplanar CT image reconstructions and MIPs were obtained to evaluate the vascular anatomy. RADIATION DOSE REDUCTION: This exam was performed according to the departmental dose-optimization program which includes automated exposure control, adjustment of the mA and/or kV according to patient size and/or use of iterative reconstruction technique. CONTRAST:  75mL OMNIPAQUE  IOHEXOL  350 MG/ML SOLN COMPARISON:  None Available. FINDINGS: Cardiovascular: Satisfactory opacification of the pulmonary arteries to the segmental level. No evidence of pulmonary embolism. Normal heart size. No pericardial effusion. Mediastinum/Nodes: Moderate sliding hiatal hernia. No mass or adenopathy. Lungs/Pleura: There is no edema, consolidation, effusion, or  pneumothorax. Upper Abdomen: Cholecystectomy.  No acute or aggressive finding Musculoskeletal: No acute or aggressive finding Review of the MIP images confirms the above findings. IMPRESSION: Negative for pulmonary embolism or other acute finding. Moderate hiatal hernia. Electronically Signed   By: Ronnette Coke M.D.   On: 11/13/2023 05:47   DG Chest Portable 1 View Result Date: 11/13/2023 CLINICAL DATA:  Chest pain and headache EXAM: PORTABLE CHEST  1 VIEW COMPARISON:  06/08/2020 FINDINGS: Artifact from overlying cardiac leads The heart size and mediastinal contours are within normal limits. Both lungs are clear. The visualized skeletal structures are unremarkable. IMPRESSION: No active disease. Electronically Signed   By: Kimberley Penman M.D.   On: 11/13/2023 05:07     Time coordinating discharge: Over 30 minutes    Faith Homes, MD  [facility] 11/14/2023, 1:07 PM

## 2023-11-14 NOTE — Progress Notes (Signed)
*  PRELIMINARY RESULTS* Echocardiogram 2D Echocardiogram has been performed.  Kathy Lowe 11/14/2023, 8:49 AM

## 2023-11-14 NOTE — Plan of Care (Signed)
  Problem: Education: Goal: Ability to describe self-care measures that may prevent or decrease complications (Diabetes Survival Skills Education) will improve 11/14/2023 0716 by Birdena Buggy, RN Outcome: Progressing 11/13/2023 1824 by Birdena Buggy, RN Outcome: Progressing Goal: Individualized Educational Video(s) 11/14/2023 0716 by Birdena Buggy, RN Outcome: Progressing 11/13/2023 1824 by Birdena Buggy, RN Outcome: Progressing   Problem: Coping: Goal: Ability to adjust to condition or change in health will improve 11/14/2023 0716 by Birdena Buggy, RN Outcome: Progressing 11/13/2023 1824 by Birdena Buggy, RN Outcome: Progressing   Problem: Fluid Volume: Goal: Ability to maintain a balanced intake and output will improve 11/14/2023 0716 by Birdena Buggy, RN Outcome: Progressing 11/13/2023 1824 by Birdena Buggy, RN Outcome: Progressing   Problem: Health Behavior/Discharge Planning: Goal: Ability to identify and utilize available resources and services will improve 11/14/2023 0716 by Birdena Buggy, RN Outcome: Progressing 11/13/2023 1824 by Birdena Buggy, RN Outcome: Progressing Goal: Ability to manage health-related needs will improve 11/14/2023 0716 by Birdena Buggy, RN Outcome: Progressing 11/13/2023 1824 by Birdena Buggy, RN Outcome: Progressing   Problem: Metabolic: Goal: Ability to maintain appropriate glucose levels will improve 11/14/2023 0716 by Birdena Buggy, RN Outcome: Progressing 11/13/2023 1824 by Birdena Buggy, RN Outcome: Progressing

## 2023-11-14 NOTE — Plan of Care (Signed)
  Problem: Education: Goal: Ability to describe self-care measures that may prevent or decrease complications (Diabetes Survival Skills Education) will improve Outcome: Progressing   Problem: Health Behavior/Discharge Planning: Goal: Ability to identify and utilize available resources and services will improve Outcome: Progressing Goal: Ability to manage health-related needs will improve Outcome: Progressing   Problem: Metabolic: Goal: Ability to maintain appropriate glucose levels will improve Outcome: Progressing   Problem: Nutritional: Goal: Maintenance of adequate nutrition will improve Outcome: Progressing

## 2023-11-14 NOTE — Hospital Course (Signed)
 Ms. Hurrell is a 58 yo female with PMH DM II, HTN who presented with chest pain and headache.  She was found to have SVT with rates in the 220s.  She was treated with adenosine and fluids.  Cardiology was also consulted.  She responded well to Cardizem. Echo was also obtained and showed normal EF, 70-75%, no RWMA, grade 1 diastolic dysfunction. She was continued on Cardizem at discharge and losartan  was held.  Outpatient follow-up with cardiology planned.

## 2023-11-15 LAB — T3: T3, Total: 122 ng/dL (ref 71–180)

## 2023-11-20 ENCOUNTER — Ambulatory Visit: Payer: Self-pay | Admitting: Family Medicine

## 2023-11-20 ENCOUNTER — Ambulatory Visit (INDEPENDENT_AMBULATORY_CARE_PROVIDER_SITE_OTHER): Admitting: Family Medicine

## 2023-11-20 ENCOUNTER — Encounter: Payer: Self-pay | Admitting: Family Medicine

## 2023-11-20 VITALS — BP 128/62 | HR 72 | Temp 97.5°F | Resp 18 | Ht 60.0 in | Wt 148.4 lb

## 2023-11-20 DIAGNOSIS — Z758 Other problems related to medical facilities and other health care: Secondary | ICD-10-CM

## 2023-11-20 DIAGNOSIS — E876 Hypokalemia: Secondary | ICD-10-CM

## 2023-11-20 DIAGNOSIS — I471 Supraventricular tachycardia, unspecified: Secondary | ICD-10-CM

## 2023-11-20 DIAGNOSIS — I10 Essential (primary) hypertension: Secondary | ICD-10-CM

## 2023-11-20 DIAGNOSIS — Z7984 Long term (current) use of oral hypoglycemic drugs: Secondary | ICD-10-CM

## 2023-11-20 DIAGNOSIS — E1165 Type 2 diabetes mellitus with hyperglycemia: Secondary | ICD-10-CM

## 2023-11-20 DIAGNOSIS — Z603 Acculturation difficulty: Secondary | ICD-10-CM

## 2023-11-20 LAB — COMPREHENSIVE METABOLIC PANEL WITH GFR
ALT: 20 U/L (ref 0–35)
AST: 21 U/L (ref 0–37)
Albumin: 4.3 g/dL (ref 3.5–5.2)
Alkaline Phosphatase: 120 U/L — ABNORMAL HIGH (ref 39–117)
BUN: 13 mg/dL (ref 6–23)
CO2: 25 meq/L (ref 19–32)
Calcium: 9.5 mg/dL (ref 8.4–10.5)
Chloride: 103 meq/L (ref 96–112)
Creatinine, Ser: 0.55 mg/dL (ref 0.40–1.20)
GFR: 100.84 mL/min (ref >60.00)
Glucose, Bld: 177 mg/dL — ABNORMAL HIGH (ref 70–99)
Potassium: 3.7 meq/L (ref 3.5–5.1)
Sodium: 138 meq/L (ref 135–145)
Total Bilirubin: 0.6 mg/dL (ref 0.2–1.2)
Total Protein: 7.1 g/dL (ref 6.0–8.3)

## 2023-11-20 MED ORDER — ONETOUCH ULTRASOFT 2 LANCETS MISC: 1.0000 | Freq: Two times a day (BID) | 1 refills | Status: AC | PRN

## 2023-11-20 MED ORDER — POTASSIUM CHLORIDE CRYS ER 10 MEQ PO TBCR: 10.0000 meq | EXTENDED_RELEASE_TABLET | Freq: Two times a day (BID) | ORAL | 0 refills | Status: AC

## 2023-11-20 MED ORDER — METFORMIN HCL 500 MG PO TABS: 500.0000 mg | ORAL_TABLET | Freq: Two times a day (BID) | ORAL | 0 refills | Status: AC

## 2023-11-20 MED ORDER — ONETOUCH VERIO VI STRP: ORAL_STRIP | 12 refills | Status: AC

## 2023-11-20 MED ORDER — ONETOUCH VERIO W/DEVICE KIT: 1.0000 | PACK | Freq: Once | 1 refills | Status: AC

## 2023-11-20 NOTE — Progress Notes (Signed)
 Subjective:     Patient ID: Kathy Lowe, female    DOB: 1966/02/26, 58 y.o.   MRN: [id number]  Chief Complaint  Patient presents with   Follow-up    Hospital follow-up from 11/13/23 for chest pain Fasting     HPI Svt. Cp/ha.  Adenosine  Discussed the use of AI scribe software for clinical note transcription with the patient, who gave verbal consent to proceed.  History of Present Illness Kathy Lowe is a 58 year old female with hypertension and diabetes who presents with chest pain and headaches. She is accompanied by her son, who is helping her translation and manage her medical care.  On May 7, she experienced chest pain, headaches, and a racing heart, prompting a hospital visit. Her heart rate was noted to be fast, and she was given adenosine  and admitted and started on Cardizem  (diltiazem ) 120mg , which she continues to take. Since then, she has had occasional dizziness and headaches but no recent episodes of heart racing or chest pain.  A week prior to the hospital visit, she had mild symptoms of dizziness and neck pain. She also reports occasional shortness of breath and a persistent cough. No swelling in her legs, vomiting, diarrhea, or heartburn.  She has a history of hypertension-was on losartan  100mg  and monitors her blood pressure at home, though not consistently recording the readings. She recalls a recent systolic reading of approximately 130 but does not remember the diastolic number.  She has diabetes and does not regularly check her blood sugar levels as she has misplaced her glucometer. She missed her f/u in Sept.  Her diet includes about four tortillas a day and a preference for sweet bread. The hosp stopped the metformin  so not taking anything  She does not work outside the home and has recently increased her physical activity by walking more. She has stopped consuming alcohol since her hospital visit, although she previously drank up to three cans a day on  occasion.    Health Maintenance Due  Topic Date Due   OPHTHALMOLOGY EXAM  Never done   Colonoscopy  Never done   MAMMOGRAM  Never done   Cervical Cancer Screening (HPV/Pap Cotest)  10/12/2017   Diabetic kidney evaluation - Urine ACR  05/01/2023   FOOT EXAM  05/29/2023    Past Medical History:  Diagnosis Date   Anemia    Diabetes mellitus without complication (HCC)     Past Surgical History:  Procedure Laterality Date   CHOLECYSTECTOMY N/A 12/09/2022   Procedure: LAPAROSCOPIC CHOLECYSTECTOMY;  Surgeon: Adalberto Acton, MD;  Location: [facility] OR;  Service: General;  Laterality: N/A;   NO PAST SURGERIES       Current Outpatient Medications:    acetaminophen  (TYLENOL ) 500 MG tablet, Take 2 tablets (1,000 mg total) by mouth every 6 (six) hours as needed for mild pain or moderate pain., Disp: , Rfl:    Blood Glucose Monitoring Suppl (ONETOUCH VERIO) w/Device KIT, 1 each by Does not apply route once for 1 dose., Disp: 1 kit, Rfl: 1   diltiazem  (CARDIZEM  CD) 120 MG 24 hr capsule, Take 1 capsule (120 mg total) by mouth daily., Disp: 90 capsule, Rfl: 2   glucose blood (ONETOUCH VERIO) test strip, Use as instructed, Disp: 100 each, Rfl: 12   glucose blood test strip, Use as instructed, Disp: 100 each, Rfl: 12   metFORMIN  (GLUCOPHAGE ) 500 MG tablet, Take 1 tablet (500 mg total) by mouth 2 (two) times daily with a meal.,  Disp: 180 tablet, Rfl: 0   potassium chloride  (KLOR-CON  M) 10 MEQ tablet, Take 1 tablet (10 mEq total) by mouth 2 (two) times daily., Disp: 180 tablet, Rfl: 0   OneTouch UltraSoft 2 Lancets MISC, 1 each by Does not apply route 2 (two) times daily as needed., Disp: 100 each, Rfl: 1  No Known Allergies ROS neg/noncontributory except as noted HPI/below      Objective:      BP 128/62 (BP Location: Left Arm, Patient Position: Sitting, Cuff Size: Normal)   Pulse 72   Temp (!) 97.5 F (36.4 C) (Temporal)   Resp 18   Ht 5' (1.524 m)   Wt 148 lb 6 oz (67.3 kg)   LMP  09/13/2014 Comment: has not had a period in 2 years, started on 09/13/14--   SpO2 94%   BMI 28.98 kg/m  Wt Readings from Last 3 Encounters:  11/20/23 148 lb 6 oz (67.3 kg)  12/25/22 149 lb 4 oz (67.7 kg)  12/08/22 150 lb 9.2 oz (68.3 kg)    Physical Exam   Gen: WDWN NAD HEENT: NCAT, conjunctiva not injected, sclera nonicteric NECK:  supple, no thyromegaly, no nodes, no carotid bruits CARDIAC: RRR, S1S2+, no murmur. DP 2+B LUNGS: CTAB. No wheezes ABDOMEN:  BS+, soft, NTND, No HSM, no masses EXT:  no edema MSK: no gross abnormalities.  NEURO: A&O x3.  CN II-XII intact.  PSYCH: normal mood. Good eye contact  Reviewed hosp records.  w/pt and son as interpreter and reviewing records and discussing plan    Assessment & Plan:  Type 2 diabetes mellitus with hyperglycemia, without long-term current use of insulin  (HCC) -     metFORMIN  HCl; Take 1 tablet (500 mg total) by mouth 2 (two) times daily with a meal.  Dispense: 180 tablet; Refill: 0 -     OneTouch UltraSoft 2 Lancets; 1 each by Does not apply route 2 (two) times daily as needed.  Dispense: 100 each; Refill: 1 -     OneTouch Verio; 1 each by Does not apply route once for 1 dose.  Dispense: 1 kit; Refill: 1 -     OneTouch Verio; Use as instructed  Dispense: 100 each; Refill: 12  Primary hypertension -     Comprehensive metabolic panel with GFR  SVT (supraventricular tachycardia) (HCC)  Hypokalemia -     Potassium Chloride  Crys ER; Take 1 tablet (10 mEq total) by mouth 2 (two) times daily.  Dispense: 180 tablet; Refill: 0 -     Comprehensive metabolic panel with GFR  Language barrier affecting health care  Assessment and Plan Assessment & Plan Diabetes mellitus, uncontrolled   Diabetes remains uncontrolled with elevated blood glucose levels due to inconsistent medication adherence and dietary management. Previous medications were expired and not taken regularly. She reports dizziness, potentially related to medication  or hypoglycemia. Dietary changes were discussed, including reducing sugar, starch, and tortilla intake, and increasing vegetables and moderate fruit consumption. Although Ozempic was considered for long-term management, concerns about side effects led to restarting metformin . Restart metformin  500mg  bid for diabetes management. Monitor blood glucose levels at different times, especially during dizziness or lightheadedness.has seen a nutritionist for dietary management, focusing on reducing sugar and starch intake. Encourage regular physical activity, such as walking for at least 30 minutes daily. Consider pharmacist consultation for medication management and education. F/u 3 wks.  Hypertension   Blood pressure was initially elevated today, though it is unclear if this is due to nervousness or  consistently high at home. She does not consistently record blood pressure readings at home. Instruct her to check blood pressure once daily at varying times and record the readings. Adjust medications if home blood pressure readings remain elevated. Currently on cardizem  120  Tachycardia   A recent episode of tachycardia was managed in the hospital with diltiazem . She is currently on diltiazem  with no further episodes of palpitations or chest pain since hospital discharge. Continue diltiazem  as prescribed. Monitor for any recurrence of symptoms and adjust treatment as necessary. No appts avail for Card until Sept.   Dizziness and headaches   Intermittent dizziness and headaches are reported. Dizziness may be related to blood glucose fluctuations or medication side effects. No recent episodes of chest pain or palpitations. Monitor blood glucose levels to assess for hypoglycemia as a cause of dizziness. Evaluate for medication side effects if dizziness persists.  Pt will need a lot of education and language barriers    Return in about 3 weeks (around 12/11/2023) for chronic follow-up.  Ellsworth Haas, MD

## 2023-11-20 NOTE — Progress Notes (Signed)
 Potassium low normal-can take the potassium as discussed

## 2023-11-20 NOTE — Patient Instructions (Addendum)
 It was very nice to see you today! Check bp daily-different times Check sugars once/day, but different times-before meal, when first wakes up, or if feels funny    PLEASE NOTE:  If you had any lab tests please let us  know if you have not heard back within a few days. You may see your results on MyChart before we have a chance to review them but we will give you a call once they are reviewed by us . If we ordered any referrals today, please let us  know if you have not heard from their office within the next week.   Please try these tips to maintain a healthy lifestyle:  Eat most of your calories during the day when you are active. Eliminate processed foods including packaged sweets (pies, cakes, cookies), reduce intake of potatoes, white bread, white pasta, and white rice. Look for whole grain options, oat flour or almond flour.  Each meal should contain half fruits/vegetables, one quarter protein, and one quarter carbs (no bigger than a computer mouse).  Cut down on sweet beverages. This includes juice, soda, and sweet tea. Also watch fruit intake, though this is a healthier sweet option, it still contains natural sugar! Limit to 3 servings daily.  Drink at least 1 glass of water with each meal and aim for at least 8 glasses per day  Exercise at least 150 minutes every week.

## 2023-12-13 ENCOUNTER — Ambulatory Visit: Admitting: Family Medicine

## 2024-01-03 ENCOUNTER — Other Ambulatory Visit (HOSPITAL_COMMUNITY): Payer: Self-pay

## 2024-01-03 ENCOUNTER — Telehealth: Payer: Self-pay

## 2024-01-03 NOTE — Telephone Encounter (Signed)
 Pharmacy Patient Advocate Encounter   Received notification from Onbase that prior authorization for ONE TOUCH VERIO KIT is required/requested.   Insurance verification completed.   The patient is insured through [facility] .   Per test claim:  ACCU- CHECK PRODUCTS is preferred by the insurance.  If suggested medication is appropriate, Please send in a new RX and discontinue this one. If not, please advise as to why it's not appropriate so that we may request a Prior Authorization. Please note, some preferred medications may still require a PA.  If the suggested medications have not been trialed and there are no contraindications to their use, the PA will not be submitted, as it will not be approved.

## 2024-01-03 NOTE — Telephone Encounter (Signed)
 Noted

## 2024-02-21 ENCOUNTER — Ambulatory Visit: Admitting: Family Medicine

## 2024-03-11 ENCOUNTER — Ambulatory Visit: Attending: Cardiovascular Disease | Admitting: Cardiovascular Disease

## 2024-03-11 NOTE — Progress Notes (Deleted)
 No chief complaint on file.   History of Present Illness: 58 yo female with history of SVT, anemia and DM who is here today for hospital follow up. She is Spanish speaking. She was admitted to [facility] in May 2025 with palpitations and chest pain. She was found to be in SVT. She converted to sinus rhythm with IV adenosine . TSH over 11. Mild troponin elevation felt to be due to demand ischemia. Echo May 2025 with LVEF=70-75%. Grade 1 diastolic dysfunction. No valve disease.   She tells me today that she ***  Primary Care Physician: Wendolyn Jenkins Jansky, MD   Past Medical History:  Diagnosis Date   Anemia    Diabetes mellitus without complication [facility])     Past Surgical History:  Procedure Laterality Date   CHOLECYSTECTOMY N/A 12/09/2022   Procedure: LAPAROSCOPIC CHOLECYSTECTOMY;  Surgeon: Signe Mitzie LABOR, MD;  Location: [facility] OR;  Service: General;  Laterality: N/A;   NO PAST SURGERIES      Current Outpatient Medications  Medication Sig Dispense Refill   acetaminophen  (TYLENOL ) 500 MG tablet Take 2 tablets (1,000 mg total) by mouth every 6 (six) hours as needed for mild pain or moderate pain.     diltiazem  (CARDIZEM  CD) 120 MG 24 hr capsule Take 1 capsule (120 mg total) by mouth daily. 90 capsule 2   glucose blood (ONETOUCH VERIO) test strip Use as instructed 100 each 12   glucose blood test strip Use as instructed 100 each 12   metFORMIN  (GLUCOPHAGE ) 500 MG tablet Take 1 tablet (500 mg total) by mouth 2 (two) times daily with a meal. 180 tablet 0   OneTouch UltraSoft 2 Lancets MISC 1 each by Does not apply route 2 (two) times daily as needed. 100 each 1   potassium chloride  (KLOR-CON  M) 10 MEQ tablet Take 1 tablet (10 mEq total) by mouth 2 (two) times daily. 180 tablet 0   No current facility-administered medications for this visit.    No Known Allergies  Social History   Socioeconomic History   Marital status: Legally Separated    Spouse name: Not on file   Number of children: 6    Years of education: Not on file   Highest education level: Not on file  Occupational History   Not on file  Tobacco Use   Smoking status: Every Day    Current packs/day: 0.25    Types: Cigarettes   Smokeless tobacco: Current   Tobacco comments:    Last time 8 days ago  Vaping Use   Vaping status: Never Used  Substance and Sexual Activity   Alcohol use: Yes    Alcohol/week: 7.0 standard drinks of alcohol    Types: 7 Cans of beer per week    Comment: weekend   Drug use: No   Sexual activity: Yes  Other Topics Concern   Not on file  Social History Narrative   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   Social Drivers of Health   Financial Resource Strain: Not on file  Food Insecurity: No Food Insecurity (11/13/2023)   Hunger Vital Sign    Worried About Running Out of Food in the Last Year: Never true    Ran Out of Food in the Last Year: Never true  Transportation Needs: No Transportation Needs (11/13/2023)   PRAPARE - Administrator, Civil Service (Medical): No    Lack of Transportation (Non-Medical): No  Physical Activity: Not on file  Stress: Not on file  Social  Connections: Not on file  Intimate Partner Violence: Not At Risk (11/13/2023)   Humiliation, Afraid, Rape, and Kick questionnaire    Fear of Current or Ex-Partner: No    Emotionally Abused: No    Physically Abused: No    Sexually Abused: No    Family History  Problem Relation Age of Onset   Cancer Sister        bones.  another sister-uterin   Diabetes Sister     Review of Systems:  As stated in the HPI and otherwise negative.   LMP 09/13/2014 Comment: has not had a period in 2 years, started on 09/13/14--   Physical Examination: General: Well developed, well nourished, NAD  HEENT: OP clear, mucus membranes moist  SKIN: warm, dry. No rashes. Neuro: No focal deficits  Musculoskeletal: Muscle strength 5/5 all ext  Psychiatric: Mood and affect normal  Neck: No JVD, no carotid bruits, no thyromegaly, no  lymphadenopathy.  Lungs:Clear bilaterally, no wheezes, rhonci, crackles Cardiovascular: Regular rate and rhythm. No murmurs, gallops or rubs. Abdomen:Soft. Bowel sounds present. Non-tender.  Extremities: No lower extremity edema. Pulses are 2 + in the bilateral DP/PT.  EKG:  EKG {ACTION; IS/IS WNU:[id number]} ordered today. The ekg ordered today demonstrates ***  Recent Labs: 11/13/2023: Hemoglobin 15.0; Magnesium 2.1; Platelets 331; TSH 3.236 11/20/2023: ALT 20; BUN 13; Creatinine, Ser 0.55; Potassium 3.7; Sodium 138   Lipid Panel    Component Value Date/Time   CHOL 176 09/25/2022 0854   TRIG 136.0 09/25/2022 0854   HDL 47.40 09/25/2022 0854   CHOLHDL 4 09/25/2022 0854   VLDL 27.2 09/25/2022 0854   LDLCALC 101 (H) 09/25/2022 0854     Wt Readings from Last 3 Encounters:  11/20/23 148 lb 6 oz (67.3 kg)  12/25/22 149 lb 4 oz (67.7 kg)  12/08/22 150 lb 9.2 oz (68.3 kg)      Assessment and Plan:   1.   Labs/ tests ordered today include:  No orders of the defined types were placed in this encounter.    Disposition:   F/U with me in ***    Signed, Lonni Cash, MD, [facility] 03/11/2024 6:48 AM    [facility] [address] [place], [place], [place]  [address] Phone: [phone]; Fax: [phone]
